# Patient Record
Sex: Male | Born: 1965 | Race: White | Hispanic: No | Marital: Single
Health system: Southern US, Community
[De-identification: ages and names within clinical notes are randomized; demographics above are authoritative.]

## PROBLEM LIST (undated history)

## (undated) DIAGNOSIS — R739 Hyperglycemia, unspecified: Secondary | ICD-10-CM

## (undated) DIAGNOSIS — I10 Essential (primary) hypertension: Secondary | ICD-10-CM

## (undated) HISTORY — DX: Hyperglycemia, unspecified: R73.9

## (undated) HISTORY — DX: Essential (primary) hypertension: I10

---

## 2021-04-04 ENCOUNTER — Inpatient Hospital Stay
Admission: EM | Admit: 2021-04-04 | Payer: Self-pay | Source: Other Acute Inpatient Hospital | Admitting: Interventional Radiology

## 2021-04-04 ENCOUNTER — Emergency Department (HOSPITAL_COMMUNITY): Payer: Medicaid Other

## 2021-04-04 ENCOUNTER — Inpatient Hospital Stay (HOSPITAL_COMMUNITY): Payer: Medicaid Other

## 2021-04-04 ENCOUNTER — Inpatient Hospital Stay (HOSPITAL_COMMUNITY): Payer: Medicaid Other | Admitting: Anesthesiology

## 2021-04-04 ENCOUNTER — Encounter (HOSPITAL_COMMUNITY): Payer: Self-pay | Admitting: Anesthesiology

## 2021-04-04 ENCOUNTER — Encounter (HOSPITAL_COMMUNITY)
Admission: EM | Disposition: A | Discharge status: Home / Self Care | Payer: Self-pay | Source: Other Acute Inpatient Hospital | Attending: Neurology

## 2021-04-04 ENCOUNTER — Encounter (HOSPITAL_COMMUNITY): Payer: Self-pay | Admitting: Neurology

## 2021-04-04 ENCOUNTER — Inpatient Hospital Stay (HOSPITAL_COMMUNITY)
Admission: EM | Admit: 2021-04-04 | Discharge: 2021-04-07 | DRG: 023 | Disposition: A | Discharge status: Home / Self Care | Payer: Medicaid Other | Source: Other Acute Inpatient Hospital | Attending: Neurology | Admitting: Neurology

## 2021-04-04 ENCOUNTER — Encounter (HOSPITAL_COMMUNITY): Payer: Self-pay | Admitting: Certified Registered Nurse Anesthetist

## 2021-04-04 ENCOUNTER — Inpatient Hospital Stay: Admit: 2021-04-04 | Payer: Medicaid Other | Admitting: Neurology

## 2021-04-04 DIAGNOSIS — I63412 Cerebral infarction due to embolism of left middle cerebral artery: Secondary | ICD-10-CM

## 2021-04-04 DIAGNOSIS — E785 Hyperlipidemia, unspecified: Secondary | ICD-10-CM | POA: Diagnosis present

## 2021-04-04 DIAGNOSIS — I6389 Other cerebral infarction: Secondary | ICD-10-CM

## 2021-04-04 DIAGNOSIS — Z20822 Contact with and (suspected) exposure to covid-19: Secondary | ICD-10-CM | POA: Diagnosis present

## 2021-04-04 DIAGNOSIS — I609 Nontraumatic subarachnoid hemorrhage, unspecified: Secondary | ICD-10-CM | POA: Diagnosis present

## 2021-04-04 DIAGNOSIS — I63512 Cerebral infarction due to unspecified occlusion or stenosis of left middle cerebral artery: Secondary | ICD-10-CM | POA: Diagnosis present

## 2021-04-04 DIAGNOSIS — R29704 NIHSS score 4: Secondary | ICD-10-CM | POA: Diagnosis present

## 2021-04-04 DIAGNOSIS — I10 Essential (primary) hypertension: Secondary | ICD-10-CM | POA: Diagnosis present

## 2021-04-04 DIAGNOSIS — L0889 Other specified local infections of the skin and subcutaneous tissue: Secondary | ICD-10-CM | POA: Diagnosis present

## 2021-04-04 DIAGNOSIS — F17200 Nicotine dependence, unspecified, uncomplicated: Secondary | ICD-10-CM | POA: Diagnosis present

## 2021-04-04 DIAGNOSIS — J9601 Acute respiratory failure with hypoxia: Secondary | ICD-10-CM | POA: Diagnosis present

## 2021-04-04 DIAGNOSIS — Q211 Atrial septal defect: Secondary | ICD-10-CM

## 2021-04-04 DIAGNOSIS — R29810 Facial weakness: Secondary | ICD-10-CM | POA: Diagnosis present

## 2021-04-04 DIAGNOSIS — R4701 Aphasia: Secondary | ICD-10-CM | POA: Diagnosis present

## 2021-04-04 DIAGNOSIS — I639 Cerebral infarction, unspecified: Secondary | ICD-10-CM

## 2021-04-04 DIAGNOSIS — Z978 Presence of other specified devices: Secondary | ICD-10-CM

## 2021-04-04 DIAGNOSIS — I6602 Occlusion and stenosis of left middle cerebral artery: Secondary | ICD-10-CM | POA: Diagnosis present

## 2021-04-04 DIAGNOSIS — I6340 Cerebral infarction due to embolism of unspecified cerebral artery: Secondary | ICD-10-CM | POA: Insufficient documentation

## 2021-04-04 HISTORY — PX: IR CT HEAD LTD: IMG2386

## 2021-04-04 HISTORY — PX: IR PERCUTANEOUS ART THROMBECTOMY/INFUSION INTRACRANIAL INC DIAG ANGIO: IMG6087

## 2021-04-04 LAB — RAPID URINE DRUG SCREEN, HOSP PERFORMED
Amphetamines: NOT DETECTED
Barbiturates: NOT DETECTED
Benzodiazepines: NOT DETECTED
Cocaine: NOT DETECTED
Opiates: NOT DETECTED
Tetrahydrocannabinol: POSITIVE — AB

## 2021-04-04 LAB — ECHOCARDIOGRAM COMPLETE
Area-P 1/2: 3.37 cm2
Height: 64 in
S' Lateral: 3.6 cm
Weight: 2144.63 oz

## 2021-04-04 LAB — POCT I-STAT 7, (LYTES, BLD GAS, ICA,H+H)
Acid-Base Excess: 0 mmol/L (ref 0.0–2.0)
Bicarbonate: 24.5 mmol/L (ref 20.0–28.0)
Calcium, Ion: 1.16 mmol/L (ref 1.15–1.40)
HCT: 41 % (ref 39.0–52.0)
Hemoglobin: 13.9 g/dL (ref 13.0–17.0)
O2 Saturation: 99 %
Potassium: 4 mmol/L (ref 3.5–5.1)
Sodium: 141 mmol/L (ref 135–145)
TCO2: 26 mmol/L (ref 22–32)
pCO2 arterial: 40.2 mmHg (ref 32.0–48.0)
pH, Arterial: 7.393 (ref 7.350–7.450)
pO2, Arterial: 135 mmHg — ABNORMAL HIGH (ref 83.0–108.0)

## 2021-04-04 LAB — RESP PANEL BY RT-PCR (FLU A&B, COVID) ARPGX2
Influenza A by PCR: NEGATIVE
Influenza B by PCR: NEGATIVE
SARS Coronavirus 2 by RT PCR: NEGATIVE

## 2021-04-04 LAB — HIV ANTIBODY (ROUTINE TESTING W REFLEX): HIV Screen 4th Generation wRfx: NONREACTIVE

## 2021-04-04 LAB — CBG MONITORING, ED: Glucose-Capillary: 99 mg/dL (ref 70–99)

## 2021-04-04 LAB — MRSA PCR SCREENING: MRSA by PCR: NEGATIVE

## 2021-04-04 SURGERY — IR WITH ANESTHESIA
Anesthesia: General

## 2021-04-04 MED ORDER — CLOPIDOGREL BISULFATE 300 MG PO TABS
ORAL_TABLET | ORAL | Status: AC
  Filled 2021-04-04: qty 1

## 2021-04-04 MED ORDER — PROPOFOL 1000 MG/100ML IV EMUL
0.0000 ug/kg/min | INTRAVENOUS | Status: DC
  Administered 2021-04-04: 50 ug/kg/min via INTRAVENOUS

## 2021-04-04 MED ORDER — ACETAMINOPHEN 650 MG RE SUPP: 650.0000 mg | RECTAL | Status: DC | PRN

## 2021-04-04 MED ORDER — POLYETHYLENE GLYCOL 3350 17 G PO PACK: 17.0000 g | PACK | Freq: Every day | ORAL | Status: DC

## 2021-04-04 MED ORDER — ACETAMINOPHEN 160 MG/5ML PO SOLN: 650.0000 mg | ORAL | Status: DC | PRN

## 2021-04-04 MED ORDER — ROCURONIUM BROMIDE 10 MG/ML (PF) SYRINGE
PREFILLED_SYRINGE | INTRAVENOUS | Status: DC | PRN
  Administered 2021-04-04: 40 mg via INTRAVENOUS
  Administered 2021-04-04: 50 mg via INTRAVENOUS

## 2021-04-04 MED ORDER — IOHEXOL 300 MG/ML  SOLN
150.0000 mL | Freq: Once | INTRAMUSCULAR | Status: AC | PRN
  Administered 2021-04-04: 90 mL via INTRA_ARTERIAL

## 2021-04-04 MED ORDER — IPRATROPIUM-ALBUTEROL 0.5-2.5 (3) MG/3ML IN SOLN: 3.0000 mL | RESPIRATORY_TRACT | Status: DC | PRN

## 2021-04-04 MED ORDER — SUCCINYLCHOLINE CHLORIDE 20 MG/ML IJ SOLN
INTRAMUSCULAR | Status: DC | PRN
  Administered 2021-04-04: 100 mg via INTRAVENOUS

## 2021-04-04 MED ORDER — CHLORHEXIDINE GLUCONATE CLOTH 2 % EX PADS
6.0000 | MEDICATED_PAD | Freq: Every day | CUTANEOUS | Status: DC
  Administered 2021-04-05 – 2021-04-06 (×2): 6 via TOPICAL

## 2021-04-04 MED ORDER — CEFAZOLIN SODIUM-DEXTROSE 2-3 GM-%(50ML) IV SOLR
INTRAVENOUS | Status: DC | PRN
  Administered 2021-04-04: 2 g via INTRAVENOUS

## 2021-04-04 MED ORDER — TIROFIBAN HCL IN NACL 5-0.9 MG/100ML-% IV SOLN
INTRAVENOUS | Status: AC
  Filled 2021-04-04: qty 100

## 2021-04-04 MED ORDER — ATORVASTATIN CALCIUM 40 MG PO TABS
40.0000 mg | ORAL_TABLET | Freq: Every day | ORAL | Status: DC
  Administered 2021-04-04 – 2021-04-07 (×4): 40 mg via ORAL
  Filled 2021-04-04 (×4): qty 1

## 2021-04-04 MED ORDER — LACTATED RINGERS IV SOLN: INTRAVENOUS | Status: DC | PRN

## 2021-04-04 MED ORDER — CANGRELOR TETRASODIUM 50 MG IV SOLR
INTRAVENOUS | Status: AC
  Filled 2021-04-04: qty 50

## 2021-04-04 MED ORDER — ASPIRIN 300 MG RE SUPP: 300.0000 mg | Freq: Every day | RECTAL | Status: DC

## 2021-04-04 MED ORDER — PANTOPRAZOLE SODIUM 40 MG IV SOLR
40.0000 mg | Freq: Every day | INTRAVENOUS | Status: DC
  Administered 2021-04-04 – 2021-04-05 (×2): 40 mg via INTRAVENOUS
  Filled 2021-04-04 (×2): qty 40

## 2021-04-04 MED ORDER — NITROGLYCERIN 1 MG/10 ML FOR IR/CATH LAB
INTRA_ARTERIAL | Status: AC
  Filled 2021-04-04: qty 10

## 2021-04-04 MED ORDER — EPHEDRINE SULFATE-NACL 50-0.9 MG/10ML-% IV SOSY
PREFILLED_SYRINGE | INTRAVENOUS | Status: DC | PRN
  Administered 2021-04-04: 5 mg via INTRAVENOUS

## 2021-04-04 MED ORDER — PHENYLEPHRINE HCL-NACL 10-0.9 MG/250ML-% IV SOLN
INTRAVENOUS | Status: DC | PRN
  Administered 2021-04-04: 50 ug/min via INTRAVENOUS

## 2021-04-04 MED ORDER — LIDOCAINE HCL (CARDIAC) PF 100 MG/5ML IV SOSY
PREFILLED_SYRINGE | INTRAVENOUS | Status: DC | PRN
  Administered 2021-04-04: 60 mg via INTRATRACHEAL
  Administered 2021-04-04: 40 mg via INTRATRACHEAL

## 2021-04-04 MED ORDER — SODIUM CHLORIDE 0.9 % IV SOLN: INTRAVENOUS | Status: DC

## 2021-04-04 MED ORDER — GLYCOPYRROLATE 0.2 MG/ML IJ SOLN
INTRAMUSCULAR | Status: DC | PRN
  Administered 2021-04-04: .2 mg via INTRAVENOUS

## 2021-04-04 MED ORDER — CLEVIDIPINE BUTYRATE 0.5 MG/ML IV EMUL
INTRAVENOUS | Status: AC
  Filled 2021-04-04: qty 50

## 2021-04-04 MED ORDER — NITROGLYCERIN 1 MG/10 ML FOR IR/CATH LAB
INTRA_ARTERIAL | Status: AC | PRN
  Administered 2021-04-04: 25 ug via INTRA_ARTERIAL

## 2021-04-04 MED ORDER — STROKE: EARLY STAGES OF RECOVERY BOOK
Freq: Once | Status: AC
  Filled 2021-04-04: qty 1

## 2021-04-04 MED ORDER — ASPIRIN 325 MG PO TABS: 325.0000 mg | ORAL_TABLET | Freq: Every day | ORAL | Status: DC

## 2021-04-04 MED ORDER — PROPOFOL 500 MG/50ML IV EMUL
INTRAVENOUS | Status: DC | PRN
  Administered 2021-04-04: 50 ug/kg/min via INTRAVENOUS

## 2021-04-04 MED ORDER — FENTANYL CITRATE (PF) 100 MCG/2ML IJ SOLN
25.0000 ug | INTRAMUSCULAR | Status: DC | PRN
  Administered 2021-04-04: 50 ug via INTRAVENOUS
  Filled 2021-04-04: qty 2

## 2021-04-04 MED ORDER — PROPOFOL 1000 MG/100ML IV EMUL
INTRAVENOUS | Status: AC
  Filled 2021-04-04: qty 100

## 2021-04-04 MED ORDER — CLEVIDIPINE BUTYRATE 0.5 MG/ML IV EMUL
0.0000 mg/h | INTRAVENOUS | Status: DC
  Administered 2021-04-04: 1 mg/h via INTRAVENOUS

## 2021-04-04 MED ORDER — VERAPAMIL HCL 2.5 MG/ML IV SOLN
INTRAVENOUS | Status: AC
  Filled 2021-04-04: qty 2

## 2021-04-04 MED ORDER — DEXAMETHASONE SODIUM PHOSPHATE 10 MG/ML IJ SOLN
INTRAMUSCULAR | Status: DC | PRN
  Administered 2021-04-04: 10 mg via INTRAVENOUS

## 2021-04-04 MED ORDER — CEFAZOLIN SODIUM-DEXTROSE 2-4 GM/100ML-% IV SOLN
INTRAVENOUS | Status: AC
  Filled 2021-04-04: qty 100

## 2021-04-04 MED ORDER — ACETAMINOPHEN 325 MG PO TABS
650.0000 mg | ORAL_TABLET | ORAL | Status: DC | PRN
  Administered 2021-04-05 (×2): 650 mg via ORAL
  Filled 2021-04-04 (×2): qty 2

## 2021-04-04 MED ORDER — DOCUSATE SODIUM 50 MG/5ML PO LIQD: 100.0000 mg | Freq: Two times a day (BID) | ORAL | Status: DC

## 2021-04-04 MED ORDER — TICAGRELOR 90 MG PO TABS
ORAL_TABLET | ORAL | Status: AC
  Filled 2021-04-04: qty 2

## 2021-04-04 MED ORDER — IOHEXOL 240 MG/ML SOLN
INTRAMUSCULAR | Status: AC
  Administered 2021-04-04: 90 mL
  Filled 2021-04-04: qty 200

## 2021-04-04 MED ORDER — FENTANYL CITRATE (PF) 100 MCG/2ML IJ SOLN
INTRAMUSCULAR | Status: DC | PRN
  Administered 2021-04-04 (×2): 50 ug via INTRAVENOUS

## 2021-04-04 MED ORDER — ASPIRIN 81 MG PO CHEW
CHEWABLE_TABLET | ORAL | Status: AC
  Filled 2021-04-04: qty 1

## 2021-04-04 MED ORDER — ONDANSETRON HCL 4 MG/2ML IJ SOLN
INTRAMUSCULAR | Status: DC | PRN
  Administered 2021-04-04: 4 mg via INTRAVENOUS

## 2021-04-04 MED ORDER — PHENYLEPHRINE 40 MCG/ML (10ML) SYRINGE FOR IV PUSH (FOR BLOOD PRESSURE SUPPORT)
PREFILLED_SYRINGE | INTRAVENOUS | Status: DC | PRN
Start: 2021-04-04 — End: 2021-04-04
  Administered 2021-04-04 (×4): 40 ug via INTRAVENOUS

## 2021-04-04 MED ORDER — ACETAMINOPHEN 325 MG PO TABS: 650.0000 mg | ORAL_TABLET | ORAL | Status: DC | PRN

## 2021-04-04 MED ORDER — PROPOFOL 10 MG/ML IV BOLUS
INTRAVENOUS | Status: DC | PRN
  Administered 2021-04-04: 30 mg via INTRAVENOUS
  Administered 2021-04-04: 110 mg via INTRAVENOUS
  Administered 2021-04-04: 20 mg via INTRAVENOUS

## 2021-04-04 MED ORDER — EPTIFIBATIDE 20 MG/10ML IV SOLN
INTRAVENOUS | Status: AC
  Filled 2021-04-04: qty 10

## 2021-04-04 MED ORDER — IOHEXOL 350 MG/ML SOLN
40.0000 mL | Freq: Once | INTRAVENOUS | Status: AC | PRN
  Administered 2021-04-04: 40 mL via INTRAVENOUS

## 2021-04-04 NOTE — Progress Notes (Signed)
RT NOTES: Pt transported to CT and back to room 4N15 on vent without incident.

## 2021-04-04 NOTE — Care Plan (Signed)
55 year old s/p left MCA M1 thrombectomy, embolic stroke of unknown source at this time  Head CT from 4 PM demonstrating worsening subarachnoid hemorrhage compared to flat panel taken immediately post IR, discussed with radiology and personally reviewed  Current vital signs: BP 115/81   Pulse 63   Temp 97.7 F (36.5 C) (Axillary)   Resp 15   Ht 5\' 4"  (1.626 m)   Wt 60.8 kg   SpO2 99%   BMI 23.01 kg/m  Vital signs in last 24 hours: Temp:  [97.3 F (36.3 C)-97.7 F (36.5 C)] 97.7 F (36.5 C) (05/16 1600) Pulse Rate:  [48-99] 63 (05/16 1900) Resp:  [0-21] 15 (05/16 1900) BP: (82-147)/(57-96) 115/81 (05/16 1900) SpO2:  [95 %-100 %] 99 % (05/16 1900) Arterial Line BP: (63-176)/(47-87) 63/54 (05/16 1700) FiO2 (%):  [40 %] 40 % (05/16 1516) Weight:  [60.8 kg] 60.8 kg (05/16 0725)  Patient is currently not on any anticoagulants or antiplatelets, is ordered to receive aspirin tomorrow at 10 AM  Plan -Will discontinue aspirin for now -Repeat head imaging in the morning to confirm stability of hemorrhage to guide decision making on when to restart antiplatelet agent (MRI already ordered for 5 AM)  07-07-2005 MD-PhD [facility] [phone]

## 2021-04-04 NOTE — H&P (Signed)
Neurology H&P  CC: Aphasia  History is obtained from: referring physician  HPI: Caleb Donaldson is a 55 y.o. male with no significant past medical history of than tobacco abuse who presents with aphasia.  He was last known well around 7 PM, and then on awakening it was noticed that he was having difficulty speaking.  He was taken to [facility] where his NIH was six and a CTA was performed which demonstrated an M1 occlusion.  He was unfortunately outside the tPA window, but given the large vessel occlusion and the significant aphasia he was transferred to [facility] for consideration of thrombectomy.  He did have some improvement en route, but he still had a significant disabling aphasia and therefore thrombectomy was discussed with the patient and his children and decision was made to proceed.   LKW: 7 PM tpa given?: No, outside of window IR Thrombectomy?  Yes Modified Rankin Scale: 0-Completely asymptomatic and back to baseline post- stroke NIHSS: 4   ROS:  Unable to obtain due to altered mental status.   History reviewed. No pertinent past medical history.   No family history on file.   Social History: +smoking   Prior to Admission medications   Not on File     Exam: Current vital signs: BP (!) 128/96   Pulse 99   Resp 15   SpO2 98%    Physical Exam  Constitutional: Appears well-developed and well-nourished.  Psych: Affect appropriate to situation Eyes: No scleral injection HENT: No OP obstrucion Head: Normocephalic.  Cardiovascular: Normal rate and regular rhythm.  Respiratory: Effort normal and breath sounds normal to anterior ascultation GI: Soft.  No distension. There is no tenderness.  Skin: WDI  Neuro: Mental Status: Patient is awake, alert, he has a significant aphasia.  He is able to name objects, but unable to repeat, very simple commands he is able to comply with, but even mildly difficult commands (touch your left ear with your right thumb) he  is unable to comprehend. Cranial Nerves: II: Visual Fields are full. Pupils are equal, round, and reactive to light.   III,IV, VI: EOMI without ptosis or diploplia.  V: Facial sensation is symmetric to temperature VII: Facial movement with right facial weakness VIII: hearing is intact to voice X: Uvula elevates symmetrically XI: Shoulder shrug is symmetric. XII: tongue is midline without atrophy or fasciculations.  Motor: Tone is normal. Bulk is normal. 5/5 strength was present in all four extremities.  Sensory: Sensation is symmetric to light touch and temperature in the arms and legs.  He does appear to have some extinction to double simultaneous stimulation Cerebellar: No clear ataxia   I have reviewed labs in epic and the pertinent results are: WBC 7.0 Hemoglobin 15.8 Hematocrit 47.9  INR 1.0 PTT 29.8  Sodium 139 Potassium 4.2 Chloride 108 CO2 26 Glucose 107 Creatinine 0.7 Protein 8.9 Albumin 4.1 Normal AST and ALT.  I have reviewed the images obtained: CT/CTA/CTP-left M1 occlusion with small area of core infarct.  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   Secondary Diagnosis:   Impression: 55 year old male with acute M1 occlusion of unclear etiology.  Though his symptoms are on the milder side, given the significance of his aphasia and the results of his CT perfusion scan he is going for mechanical thrombectomy.  He will need further work-up for stroke risk factors  Plan: - HgbA1c, fasting lipid panel - MRI, MRA  of the brain without  contrast - Frequent neuro checks - Echocardiogram - Carotid dopplers - Prophylactic therapy-pending intervention - Risk factor modification - Telemetry monitoring - PT consult, OT consult, Speech consult - Stroke team to follow    This patient is critically ill and at significant risk of neurological worsening, death and care requires constant monitoring of vital  signs, hemodynamics,respiratory and cardiac monitoring, neurological assessment, discussion with family, other specialists and medical decision making of high complexity. I spent 50 minutes of neurocritical care time  in the care of  this patient. This was time spent independent of any time provided by nurse practitioner or PA.  Ritta Slot, MD [facility] [phone]  If 7pm- 7am, please page neurology on call as listed in AMION.

## 2021-04-04 NOTE — Procedures (Signed)
S/P Lt common carotid arteriogram Rt CFA approach . S/P revascularization of Lt MCA , and prox sup and inf divions with x 2 passes with 5 mm x 37 mm embotrap and x 1 pass with contact aspiration achieving a TICI 3 revascularization. Post CT brain mod hyper density  Overlying Ly sylvian fissure and Lt temp parietal region . No mass effect. 29f exoseal for RT CFA hemostasis . Distal pulses all dopplerable. Left intubated due to [facility]?contrast . S.Jasmynn Pfalzgraf MD

## 2021-04-04 NOTE — Procedures (Signed)
Extubation Procedure Note  Patient Details:   Name: Caleb Donaldson DOB: 1966/05/07 MRN: [id number]   Airway Documentation:   Pt extubated per orders, pt had positive cuff leak prior to extubation. Patient has strong cough and able to voice. Placed on 4lpm humidified oxygen. Will continue to monitor. Vent end date: 04/04/21 Vent end time: 1627   Evaluation  O2 sats: stable throughout Complications: No apparent complications Patient did tolerate procedure well. Bilateral Breath Sounds: Clear   Yes  Suszanne Conners 04/04/2021, 4:27 PM

## 2021-04-04 NOTE — Progress Notes (Signed)
Pt arrived from NIR at 1015.Groin site and peripheral pulses WNL. He is sedated on vent. q 15 min VS mNIHSS groin checks for one hr then q 30 min for one hr then hourly.

## 2021-04-04 NOTE — Progress Notes (Signed)
Referring Physician(s): Code stoke- Rejeana Brock (neurology)  Supervising Physician: Julieanne Cotton  Patient Status:  [facility] - In-pt  Chief Complaint:  History of acute CVA s/p cerebral arteriogram with emergent mechanical thrombectomy of left MCA occlusion achieving a TICI 3 revascularization via right femoral approach 04/04/2021 by Dr. Corliss Skains.  Subjective:  Patient laying in bed intubated with sedation. He opens eyes to voice, follows simple commands, nods appropriately to yes/no questions. Moving all extremities.   Allergies: Patient has no known allergies.  Medications: Prior to Admission medications   Not on File     Vital Signs: BP 115/76   Pulse 60   Temp (!) 97.3 F (36.3 C) (Axillary)   Resp 18   Ht 5\' 4"  (1.626 m)   Wt 134 lb 0.6 oz (60.8 kg)   SpO2 100%   BMI 23.01 kg/m   Physical Exam Vitals and nursing note reviewed.  Constitutional:      General: He is not in acute distress.    Comments: Intubated with sedation.  Pulmonary:     Effort: Pulmonary effort is normal. No respiratory distress.     Comments: Intubated with sedation. Skin:    General: Skin is warm and dry.     Comments: Right femoral puncture site stable without active bleeding or hematoma.  Neurological:     Comments: Intubated with sedation. Opens eyes to voice, follows simple commands, nods appropriately to yes/no questions. PERRL bilaterally. EOMs intact bilaterally without nystagmus or subjective diplopia. Moving all extremities. Distal pulses (DPs) palpable bilaterally.     Imaging: CT CEREBRAL PERFUSION W CONTRAST  Result Date: 04/04/2021 CLINICAL DATA:  Stroke follow-up.  Aphasia EXAM: CT PERFUSION BRAIN TECHNIQUE: Multiphase CT imaging of the brain was performed following IV bolus contrast injection. Subsequent parametric perfusion maps were calculated using RAPID software. CONTRAST:  29mL OMNIPAQUE IOHEXOL 350 MG/ML SOLN COMPARISON:  CTA of the head neck  from earlier today at [facility] FINDINGS: CT Brain Perfusion Findings: CBF (<30%) Volume: 67mL Perfusion (Tmax>6.0s) volume: 47mL Mismatch Volume: 81mL ASPECTS on noncontrast CT Head: 10 on head CT performed at the same time Infarct Core: 14 cc mL Infarction Location: Lateral left frontal lobe. IMPRESSION: Good correlation with prior CTA and aspects. 79 cc of left MCA penumbra with a background of 14 cc core infarct. Electronically Signed   By: 78m M.D.   On: 04/04/2021 07:49   Portable Chest x-ray  Result Date: 04/04/2021 CLINICAL DATA:  ET tube placement. EXAM: PORTABLE CHEST 1 VIEW COMPARISON:  12/23/2012 FINDINGS: 1144 hours. Endotracheal tube tip is 4.2 cm above the base of the carina. The NG tube passes into the stomach although the distal tip position is not included on the film. The lungs are clear without focal pneumonia, edema, pneumothorax or pleural effusion. The cardiopericardial silhouette is within normal limits for size. The visualized bony structures of the thorax show no acute abnormality. Telemetry leads overlie the chest. IMPRESSION: No active disease. Electronically Signed   By: 02/20/2013 M.D.   On: 04/04/2021 12:18   ECHOCARDIOGRAM COMPLETE  Result Date: 04/04/2021    ECHOCARDIOGRAM REPORT   Patient Name:   Caleb Donaldson Date of Exam: 04/04/2021 Medical Rec #:  04/06/2021      Height: Accession #:    [id number]     Weight:       134.0 lb Date of Birth:  July 30, 1966      BSA:          1.546  m Patient Age:    55 years       BP:           140/96 mmHg Patient Gender: M              HR:           77 bpm. Exam Location:  Inpatient Procedure: 2D Echo Indications:    Hx of CVA  History:        Patient has no prior history of Echocardiogram examinations.  Referring Phys: [id number] MCNEILL P KIRKPATRICK IMPRESSIONS  1. Left ventricular ejection fraction, by estimation, is 55 to 60%. The left ventricle has normal function. The left ventricle has no regional wall motion  abnormalities. Left ventricular diastolic parameters were normal.  2. Right ventricular systolic function is normal. The right ventricular size is normal.  3. Right atrial size was mildly dilated.  4. The mitral valve is normal in structure. Trivial mitral valve regurgitation.  5. The aortic valve is normal in structure. Aortic valve regurgitation is not visualized.  6. The inferior vena cava is normal in size with greater than 50% respiratory variability, suggesting right atrial pressure of 3 mmHg. FINDINGS  Left Ventricle: Left ventricular ejection fraction, by estimation, is 55 to 60%. The left ventricle has normal function. The left ventricle has no regional wall motion abnormalities. The left ventricular internal cavity size was normal in size. There is  no left ventricular hypertrophy. Left ventricular diastolic parameters were normal. Right Ventricle: The right ventricular size is normal. Right vetricular wall thickness was not assessed. Right ventricular systolic function is normal. Left Atrium: Left atrial size was normal in size. Right Atrium: Right atrial size was mildly dilated. Pericardium: There is no evidence of pericardial effusion. Mitral Valve: The mitral valve is normal in structure. Trivial mitral valve regurgitation. Tricuspid Valve: The tricuspid valve is normal in structure. Tricuspid valve regurgitation is trivial. Aortic Valve: The aortic valve is normal in structure. Aortic valve regurgitation is not visualized. Pulmonic Valve: The pulmonic valve was not well visualized. Pulmonic valve regurgitation is not visualized. Aorta: The aortic root is normal in size and structure. Venous: The inferior vena cava is normal in size with greater than 50% respiratory variability, suggesting right atrial pressure of 3 mmHg. IAS/Shunts: No atrial level shunt detected by color flow Doppler.  LEFT VENTRICLE PLAX 2D LVIDd:         4.20 cm  Diastology LVIDs:         3.60 cm  LV e' medial:    9.68 cm/s LV PW:          1.00 cm  LV E/e' medial:  6.3 LV IVS:        0.70 cm  LV e' lateral:   12.50 cm/s LVOT diam:     1.90 cm  LV E/e' lateral: 4.9 LV SV:         51 LV SV Index:   33 LVOT Area:     2.84 cm  RIGHT VENTRICLE            IVC RV Basal diam:  3.10 cm    IVC diam: 1.50 cm RV Mid diam:    2.50 cm RV S prime:     9.46 cm/s TAPSE (M-mode): 1.6 cm LEFT ATRIUM             Index       RIGHT ATRIUM           Index LA diam:  2.60 cm 1.68 cm/m  RA Area:     11.80 cm LA Vol (A2C):   29.2 ml 18.88 ml/m RA Volume:   28.00 ml  18.11 ml/m LA Vol (A4C):   14.1 ml 9.12 ml/m LA Biplane Vol: 21.3 ml 13.77 ml/m  AORTIC VALVE LVOT Vmax:   74.40 cm/s LVOT Vmean:  52.100 cm/s LVOT VTI:    0.181 m  AORTA Ao Root diam: 3.10 cm Ao Asc diam:  2.70 cm MITRAL VALVE MV Area (PHT): 3.37 cm    SHUNTS MV Decel Time: 225 msec    Systemic VTI:  0.18 m MV E velocity: 61.30 cm/s  Systemic Diam: 1.90 cm MV A velocity: 52.30 cm/s MV E/A ratio:  1.17 Dietrich Pates MD Electronically signed by Dietrich Pates MD Signature Date/Time: 04/04/2021/11:43:32 AM    Final    CT HEAD CODE STROKE WO CONTRAST  Result Date: 04/04/2021 CLINICAL DATA:  Code stroke.  Stroke follow-up EXAM: CT HEAD WITHOUT CONTRAST TECHNIQUE: Contiguous axial images were obtained from the base of the skull through the vertex without intravenous contrast. COMPARISON:  Earlier today FINDINGS: Brain: No evidence of acute infarction, hemorrhage, hydrocephalus, extra-axial collection or mass lesion/mass effect. Small remote left cerebellar infarct. Vascular: High-density vessels diffusely in the setting of remote cyst in IV contrast Skull: Normal. Negative for fracture or focal lesion. Sinuses/Orbits: Sinus opacification on a chronic or recurrent basis, incidental to the history. Other: These results were called by telephone at the time of interpretation on 04/04/2021 at 7:41 am to provider MCNEILL [facility] , who verbally acknowledged these results. ASPECTS [facility] Stroke Program  Early CT Score) - Ganglionic level infarction (caudate, lentiform nuclei, internal capsule, insula, M1-M3 cortex): 7 - Supraganglionic infarction (M4-M6 cortex): 3 Total score (0-10 with 10 being normal): 10 IMPRESSION: Stable head CT.  ASPECTS remains 10. Electronically Signed   By: Marnee Spring M.D.   On: 04/04/2021 07:42   VAS [place] LOWER EXTREMITY VENOUS (DVT)  Result Date: 04/04/2021  Lower Venous DVT Study Patient Name:  WIN GUAJARDO  Date of Exam:   04/04/2021 Medical Rec #: [id number]       Accession #:    [id number] Date of Birth: 21-Apr-1966       Patient Gender: M Patient Age:   54Y Exam Location:  [facility] Procedure:      VAS [place] LOWER EXTREMITY VENOUS (DVT) Referring Phys: [id number] Marvel Plan --------------------------------------------------------------------------------  Indications: Stroke.  Comparison Study: no prior Performing Technologist: Blanch Media RVS  Examination Guidelines: A complete evaluation includes B-mode imaging, spectral Doppler, color Doppler, and power Doppler as needed of all accessible portions of each vessel. Bilateral testing is considered an integral part of a complete examination. Limited examinations for reoccurring indications may be performed as noted. The reflux portion of the exam is performed with the patient in reverse Trendelenburg.  +---------+---------------+---------+-----------+----------+--------------+ RIGHT    CompressibilityPhasicitySpontaneityPropertiesThrombus Aging +---------+---------------+---------+-----------+----------+--------------+ CFV      Full           Yes      Yes                                 +---------+---------------+---------+-----------+----------+--------------+ SFJ      Full                                                        +---------+---------------+---------+-----------+----------+--------------+  FV Prox  Full                                                         +---------+---------------+---------+-----------+----------+--------------+ FV Mid   Full                                                        +---------+---------------+---------+-----------+----------+--------------+ FV DistalFull                                                        +---------+---------------+---------+-----------+----------+--------------+ PFV      Full                                                        +---------+---------------+---------+-----------+----------+--------------+ POP      Full           Yes      Yes                                 +---------+---------------+---------+-----------+----------+--------------+ PTV      Full                                                        +---------+---------------+---------+-----------+----------+--------------+ PERO     Full                                                        +---------+---------------+---------+-----------+----------+--------------+   +---------+---------------+---------+-----------+----------+--------------+ LEFT     CompressibilityPhasicitySpontaneityPropertiesThrombus Aging +---------+---------------+---------+-----------+----------+--------------+ CFV      Full           Yes      Yes                                 +---------+---------------+---------+-----------+----------+--------------+ SFJ      Full                                                        +---------+---------------+---------+-----------+----------+--------------+ FV Prox  Full                                                        +---------+---------------+---------+-----------+----------+--------------+  FV Mid   Full                                                        +---------+---------------+---------+-----------+----------+--------------+ FV DistalFull                                                         +---------+---------------+---------+-----------+----------+--------------+ PFV      Full                                                        +---------+---------------+---------+-----------+----------+--------------+ POP      Full           Yes      Yes                                 +---------+---------------+---------+-----------+----------+--------------+ PTV      Full                                                        +---------+---------------+---------+-----------+----------+--------------+ PERO     Full                                                        +---------+---------------+---------+-----------+----------+--------------+     Summary: BILATERAL: - No evidence of deep vein thrombosis seen in the lower extremities, bilaterally. -No evidence of popliteal cyst, bilaterally.   *See table(s) above for measurements and observations.    Preliminary     Labs:  CBC: Recent Labs    04/04/21 1020  HGB 13.9  HCT 41.0    COAGS: No results for input(s): INR, APTT in the last 8760 hours.  BMP: Recent Labs    04/04/21 1020  NA 141  K 4.0     Assessment and Plan:  History of acute CVA s/p cerebral arteriogram with emergent mechanical thrombectomy of left MCA occlusion achieving a TICI 3 revascularization via right femoral approach 04/04/2021 by Dr. Corliss Skainseveshwar. Patient intubated/sedated but opens eyes to voice and follows simple commands, nods appropiately to yes/no questions, moving all extremities. Right femoral puncture site stable, distal pulses (DPs) palpable bilaterally. For CT head and possible extubation today. Further plans per neurology- appreciate and agree with management. NIR to follow.   Electronically Signed: Elwin MochaAlexandra Tri Chittick, PA-C 04/04/2021, 3:38 PM   I spent a total of 15 Minutes at the the patient's bedside AND on the patient's hospital floor or unit, greater than 50% of which was counseling/coordinating care for CVA s/p  revascularization.

## 2021-04-04 NOTE — Anesthesia Preprocedure Evaluation (Addendum)
Anesthesia Evaluation  Patient identified by MRN, date of birth, ID band Patient awake    Reviewed: Allergy & Precautions, NPO status , Patient's Chart, lab work & pertinent test results  Airway Mallampati: II  TM Distance: >3 FB Neck ROM: Full    Dental  (+) Edentulous Upper, Edentulous Lower, Dental Advisory Given   Pulmonary Current Smoker,    breath sounds clear to auscultation (-) wheezing      Cardiovascular negative cardio ROS   Rhythm:Regular Rate:Normal     Neuro/Psych CVA    GI/Hepatic negative GI ROS, Neg liver ROS,   Endo/Other  negative endocrine ROS  Renal/GU negative Renal ROS     Musculoskeletal negative musculoskeletal ROS (+)   Abdominal   Peds  Hematology negative hematology ROS (+)   Anesthesia Other Findings   Reproductive/Obstetrics                             Anesthesia Physical Anesthesia Plan  ASA: II and emergent  Anesthesia Plan: General   Post-op Pain Management:    Induction: Intravenous  PONV Risk Score and Plan: 2 and Ondansetron, Dexamethasone and Treatment may vary due to age or medical condition  Airway Management Planned: Oral ETT  Additional Equipment: Arterial line  Intra-op Plan:   Post-operative Plan: Extubation in OR  Informed Consent: I have reviewed the patients History and Physical, chart, labs and discussed the procedure including the risks, benefits and alternatives for the proposed anesthesia with the patient or authorized representative who has indicated his/her understanding and acceptance.     Dental advisory given  Plan Discussed with: CRNA  Anesthesia Plan Comments:        Anesthesia Quick Evaluation

## 2021-04-04 NOTE — Code Documentation (Signed)
Stroke Response Nurse Documentation Code Documentation  Caleb Donaldson is a 55 y.o. male arriving to [facility] ED via [facility] on 04/04/2021 with past medical hx of smoker. Code IR was activated by [facility] Emergency Department. Patient from home where he was LKW at 1900 on 04/03/2021 and now complaining of difficulty with speech. On No antithrombotic, he was given 300mg  Aspirin suppository today prior to transport to [facility]. Stroke team at the bedside on patient arrival. Patient cleared for CT by Dr. [facility]. Patient to CT with team. NIHSS 4, see documentation for details and code stroke times. Patient with disoriented, right facial droop, Global aphasia  and Sensory  neglect on exam. The following imaging was completed: CT, CTP. Following imaging pt taken to IR for intervention.  Bedside handoff with Criss Alvine., RN  Loraine Leriche  Rapid Response RN

## 2021-04-04 NOTE — Anesthesia Preprocedure Evaluation (Deleted)
Anesthesia Evaluation    Reviewed: Allergy & Precautions, Patient's Chart, lab work & pertinent test results, Unable to perform ROS - Chart review onlyPreop documentation limited or incomplete due to emergent nature of procedure.  Airway        Dental   Pulmonary neg pulmonary ROS,           Cardiovascular negative cardio ROS       Neuro/Psych negative neurological ROS     GI/Hepatic negative GI ROS, Neg liver ROS,   Endo/Other  negative endocrine ROS  Renal/GU negative Renal ROS     Musculoskeletal negative musculoskeletal ROS (+)   Abdominal   Peds  Hematology negative hematology ROS (+)   Anesthesia Other Findings   Reproductive/Obstetrics                             Anesthesia Physical Anesthesia Plan  ASA: emergent  Anesthesia Plan: General   Post-op Pain Management:    Induction:   PONV Risk Score and Plan: Ondansetron, Dexamethasone and Treatment may vary due to age or medical condition  Airway Management Planned: Oral ETT  Additional Equipment: Arterial line  Intra-op Plan:   Post-operative Plan: Possible Post-op intubation/ventilation  Informed Consent:   Plan Discussed with:   Anesthesia Plan Comments:         Anesthesia Quick Evaluation

## 2021-04-04 NOTE — Transfer of Care (Signed)
Immediate Anesthesia Transfer of Care Note  Patient: Caleb Donaldson  Procedure(s) Performed: IR PERCUTANEOUS ART THROMBECTOMY/INFUSION INTRACRANIAL INC DIAG ANGIO IR CT HEAD LTD  Patient Location: ICU  Anesthesia Type:General  Level of Consciousness: sedated and unresponsive  Airway & Oxygen Therapy: Patient remains intubated per anesthesia plan and Patient placed on Ventilator (see vital sign flow sheet for setting)  Post-op Assessment: Report given to RN and Post -op Vital signs reviewed and stable  Post vital signs: Reviewed and stable  Last Vitals:  Vitals Value Taken Time  BP 109/77 04/04/21 1015  Temp    Pulse 62 04/04/21 1019  Resp 18 04/04/21 1019  SpO2 100 % 04/04/21 1019  Vitals shown include unvalidated device data.  Last Pain: There were no vitals filed for this visit.       Complications: No complications documented.

## 2021-04-04 NOTE — Anesthesia Procedure Notes (Signed)
Procedure Name: Intubation Date/Time: 04/04/2021 7:49 AM Performed by: Marena Chancy, CRNA Pre-anesthesia Checklist: Patient identified, Emergency Drugs available, Suction available and Patient being monitored Patient Re-evaluated:Patient Re-evaluated prior to induction Oxygen Delivery Method: Circle System Utilized Preoxygenation: Pre-oxygenation with 100% oxygen Induction Type: IV induction, Rapid sequence and Cricoid Pressure applied Laryngoscope Size: 4 Grade View: Grade I Tube type: Oral Tube size: 7.5 mm Number of attempts: 1 Airway Equipment and Method: Stylet and Oral airway Placement Confirmation: ETT inserted through vocal cords under direct vision,  positive ETCO2 and breath sounds checked- equal and bilateral Tube secured with: Tape Dental Injury: Teeth and Oropharynx as per pre-operative assessment

## 2021-04-04 NOTE — Anesthesia Postprocedure Evaluation (Signed)
Anesthesia Post Note  Patient: Caleb Donaldson  Procedure(s) Performed: IR PERCUTANEOUS ART THROMBECTOMY/INFUSION INTRACRANIAL INC DIAG ANGIO IR CT HEAD LTD     Patient location during evaluation: ICU Anesthesia Type: General Level of consciousness: patient remains intubated per anesthesia plan and sedated Pain management: pain level controlled Vital Signs Assessment: post-procedure vital signs reviewed and stable Respiratory status: patient remains intubated per anesthesia plan, patient on ventilator - see flowsheet for VS and respiratory function stable Cardiovascular status: stable Anesthetic complications: no   No complications documented.  Last Vitals:  Vitals:   04/04/21 0753 04/04/21 1001  BP: (!) 128/96 (!) 141/89  Pulse: 99 78  Resp: 15 18  SpO2: 98% 100%    Last Pain: There were no vitals filed for this visit.               Kaylyn Layer

## 2021-04-04 NOTE — Progress Notes (Signed)
NAME:  Caleb Donaldson, MRN:  [id number], DOB:  1966-04-01, LOS: 0 ADMISSION DATE:  04/04/2021, CONSULTATION DATE: 5/16 REFERRING MD:  Arvilla Market MD, CHIEF COMPLAINT:  Aphasia  History of Present Illness:  Ashe Gago is a 55 y.o. with a reported past medical history of smoking, who presented to [place] on 5/16 and was found to have an acute M1 occlusion.  Per chart review, his last known normal was around 7pm on 5/15. He presented to the [facility] ED around 7AM with significant aphasia. Workup showed an M1 occlusion, OSH NIHSS 4. He was unfortionately outside the window to receive TPA. He was transferred to [facility] for thrombectomy.  NeuroIR completed thrombectomy on 6/16 with revascularization of the L MCA (TICI 3) after three passes. There was some reported concern of constrast extravization post procedure. He was transferred to 4N post procedure for further care.  PCCM was consulted for ventilatory assistance.    Pertinent  Medical History  Active Smoking  Significant Hospital Events: Including procedures, antibiotic start and stop dates in addition to other pertinent events   . 5/15 LKW 7pm . 5/16 presented to [place] 7am, [facility]. NIR thrombectomy of L MCA.  Interim History / Subjective:  See Above  Unable to obtain subjective evaluation due to patient status  Objective   Blood pressure (!) 141/89, pulse 78, resp. rate 18, height 5\' 4"  (1.626 m), weight 60.8 kg, SpO2 100 %.    Vent Mode: PRVC FiO2 (%):  [40 %] 40 % Set Rate:  [18 bmp] 18 bmp Vt Set:  [470 mL] 470 mL PEEP:  [5 cmH20] 5 cmH20   Intake/Output Summary (Last 24 hours) at 04/04/2021 1058 Last data filed at 04/04/2021 04/06/2021 Gross per 24 hour  Intake 850 ml  Output --  Net 850 ml   Filed Weights   04/04/21 0725  Weight: 60.8 kg    Examination: General: In bed, appears comfortable, no acute distress  HEENT: MM pink/moist, anicteric, trachea midline, ETT, OGT  Neuro: RASS -5, Sedated, chemically  paralyzed  CV: S1S2, NSR, no m/r/g appreciated PULM:  Clear in the upper lobes, clear in the lower lobes, thin secretions, chest expansion symmetric GI: soft, bsx4 hypoactive, soft   Extremities: warm/dry, no pretibial edema, capillary refill less than 3 seconds  Skin: Pustules present on BL dorsalis pedis. See images in chart  Labs/imaging that I havepersonally reviewed  (right click and "Reselect all SmartList Selections" daily)  ABG 7.39/40/135/24.5 UDS +THC Head CT [facility] Problem list     Assessment & Plan:  M1 Occlusion, S/P L MCA mechanical thrombectomy (TICI 3)  -Mangement per neurology/stroke, Stroke workup per neurology -Head CT/MRI in six hours -Goal BP 120-140 for first 24 hours. Goal normotension post 24 hours.  Acute Respiratory Failure requiring Mechanical Ventilation ABG 7.39/40/135/24.5, reports of paralytic pushes prior to procedure, not breathing over ventilator, no twitches on TOF -LTVV strategy with tidal volumes of 4-8 cc/kg ideal body weight -Goal plateau pressures of 30 and driving pressures of 15 -Wean PEEP/FiO2 for SpO2 92-98 -Follow intermittent CXR and ABG PRN -Continue propofol for next hour. Then obtain SAT/SBT -Sheath recently removed, will try to extubate patient if able. May attempt to wait until post MRI/CT for extubation depending on patient status. -VAP bundle  Active Smoking History -PRN duonebs -Smoking cessation education when appropriate  Pustules on BL dorsalis pedis See images in chart, ?unclear etiology -Follow up ECHO -Continue to monitor  Best practice (right click and "Reselect all  SmartList Selections" daily)  Diet:  NPO Pain/Anxiety/Delirium protocol (if indicated): Yes (RASS goal 0) VAP protocol (if indicated): Yes DVT prophylaxis: Contraindicated GI prophylaxis: PPI Glucose control:  SSI No Central venous access:  N/A Arterial line:  Yes, and it is still needed Foley:  N/A Mobility:  bed rest  PT  consulted: N/A Last date of multidisciplinary goals of care discussion [Per primary] Code Status:  full code Disposition: ICU  Labs   CBC: Recent Labs  Lab 04/04/21 1020  HGB 13.9  HCT 41.0    Basic Metabolic Panel: Recent Labs  Lab 04/04/21 1020  NA 141  K 4.0   GFR: CrCl cannot be calculated (No successful lab value found.). No results for input(s): PROCALCITON, WBC, LATICACIDVEN in the last 168 hours.  Liver Function Tests: No results for input(s): AST, ALT, ALKPHOS, BILITOT, PROT, ALBUMIN in the last 168 hours. No results for input(s): LIPASE, AMYLASE in the last 168 hours. No results for input(s): AMMONIA in the last 168 hours.  ABG    Component Value Date/Time   PHART 7.393 04/04/2021 1020   PCO2ART 40.2 04/04/2021 1020   PO2ART 135 (H) 04/04/2021 1020   HCO3 24.5 04/04/2021 1020   TCO2 26 04/04/2021 1020   O2SAT 99.0 04/04/2021 1020     Coagulation Profile: No results for input(s): INR, PROTIME in the last 168 hours.  Cardiac Enzymes: No results for input(s): CKTOTAL, CKMB, CKMBINDEX, TROPONINI in the last 168 hours.  HbA1C: No results found for: HGBA1C  CBG: Recent Labs  Lab 04/04/21 0727  GLUCAP 99    Review of Systems:   Unable to obtain ROS evaluation due to patient status   Past Medical History:  He,  has no past medical history on file.   Surgical History:  History reviewed. No pertinent surgical history.   Social History:      Family History:  His family history is not on file.   Allergies No Known Allergies   Home Medications  Prior to Admission medications   Not on File     Critical care time: 34 Minutes     Gershon Mussel., MSN, APRN, AGACNP-BC  Pulmonary & Critical Care  04/04/2021 , 10:58 AM  Please see [email or web address] for pager details  If no response, please call [phone] After hours, please call Elink at [phone]

## 2021-04-04 NOTE — CV Procedure (Signed)
INR. 54y RT H M LSW last night ?time MRS 0   New onset RT sided weakness and aphasia. CT brain No ICH and ASPECTS 10 CTA occluded Lt MCA M 1 seg with clot extension into sup and inf divisions.. CTP core iod approx 13 ml and penumbra of approx 90 ml Endovascular treatment discussed with daughter.Procedure,reasons and alternatives reviewed. Risks of ICH of 10 %,worsening neuro deficit,death and inability to revascularize discussed. Daughter expressed understanding and provided consent for the treatment. S.Hezikiah Retzloff MD

## 2021-04-04 NOTE — Progress Notes (Addendum)
NAME:  Caleb Donaldson, MRN:  [id number], DOB:  [birth year], LOS: 0 ADMISSION DATE:  04/04/2021, CONSULTATION DATE: 5/16 REFERRING MD:  Arvilla Market MD, CHIEF COMPLAINT:  Aphasia  History of Present Illness:  Caleb Donaldson is a 55 y.o. with a reported past medical history of smoking, who presented to [facility] on 5/16 and was found to have an acute M1 occlusion.  Per chart review, his last known normal was around 7pm on 5/15. He presented to the [facility] ED around 7AM with significant aphasia. Workup showed an M1 occlusion, OSH NIHSS 4. He was unfortionately outside the window to receive TPA. He was transferred to [facility] for thrombectomy.  NeuroIR completed thrombectomy on 5/16 with revascularization of the L MCA (TICI 3) after three passes. There was some reported concern of contrast extravasation post procedure. He was transferred to 4N post procedure for further care.  PCCM was consulted for ventilatory assistance.    Pertinent  Medical History  Active Smoking  Significant Hospital Events: Including procedures, antibiotic start and stop dates in addition to other pertinent events   . 5/15 LKW 7pm . 5/16 presented to [place] 7am, [facility]. NIR thrombectomy of L MCA.  Interim History / Subjective:  Unable to obtain subjective evaluation due to patient status  Objective   Blood pressure 99/63, pulse (!) 53, temperature (!) 97.3 F (36.3 C), temperature source Axillary, resp. rate (!) 0, height 5\' 4"  (1.626 m), weight 60.8 kg, SpO2 99 %.    Vent Mode: PRVC FiO2 (%):  [40 %] 40 % Set Rate:  [18 bmp] 18 bmp Vt Set:  [470 mL] 470 mL PEEP:  [5 cmH20] 5 cmH20   Intake/Output Summary (Last 24 hours) at 04/04/2021 1233 Last data filed at 04/04/2021 04/06/2021 Gross per 24 hour  Intake 850 ml  Output --  Net 850 ml   Filed Weights   04/04/21 0725  Weight: 60.8 kg    Examination: General: In bed, appears comfortable, no acute distress  HEENT: MM pink/moist, anicteric, ETT, OGT   Neuro: RASS -5, Sedated, chemically paralyzed  CV: S1S2, RRR, no murmur Lung: bilateral clear lung songs, no crepitations or crackles GI: soft, nontender and nondistended, normal bowel sound  Extremities: no pedal, normal capillary refill time Skin: pustules present on bilateral dorsum  Labs/imaging that I havepersonally reviewed  (right click and "Reselect all SmartList Selections" daily)  ABG 7.39/40/135/24.5 UDS +THC Head CT  Resolved Hospital Problem list     Assessment & Plan:  M1 Occlusion, S/P L MCA mechanical thrombectomy (TICI 3)  -Mangement per neurology/stroke, Stroke workup per neurology -Head CT in six hours as the patient has labile blood pressure -Goal BP 120-140 for first 24 hours. Goal normotension post 24 hours.  Acute Respiratory Failure requiring Mechanical Ventilation ABG 7.39/40/135/24.5, reports of paralytic pushes prior to procedure, not breathing over ventilator, no twitches on TOF -protective lung ventilation -VAP bundle -SAT/ SBT for liberation  Skin pustules B/L feet - unclear etiology -Follow up ECHO -Continue to monitor  Active smoking History -Smoking cessation education when appropriate    Best practice (right click and "Reselect all SmartList Selections" daily)  Diet:  NPO Pain/Anxiety/Delirium protocol (if indicated): Yes (RASS goal 0) VAP protocol (if indicated): Yes DVT prophylaxis: Contraindicated GI prophylaxis: PPI Glucose control:  SSI No Central venous access:  N/A Arterial line:  Yes, and it is still needed Foley:  N/A Mobility:  bed rest  PT consulted: N/A Last date of multidisciplinary goals of care discussion [Per primary]  Code Status:  full code Disposition: ICU  Labs   CBC: Recent Labs  Lab 04/04/21 1020  HGB 13.9  HCT 41.0    Basic Metabolic Panel: Recent Labs  Lab 04/04/21 1020  NA 141  K 4.0   GFR: CrCl cannot be calculated (No successful lab value found.). No results for input(s): PROCALCITON,  WBC, LATICACIDVEN in the last 168 hours.  Liver Function Tests: No results for input(s): AST, ALT, ALKPHOS, BILITOT, PROT, ALBUMIN in the last 168 hours. No results for input(s): LIPASE, AMYLASE in the last 168 hours. No results for input(s): AMMONIA in the last 168 hours.  ABG    Component Value Date/Time   PHART 7.393 04/04/2021 1020   PCO2ART 40.2 04/04/2021 1020   PO2ART 135 (H) 04/04/2021 1020   HCO3 24.5 04/04/2021 1020   TCO2 26 04/04/2021 1020   O2SAT 99.0 04/04/2021 1020     Coagulation Profile: No results for input(s): INR, PROTIME in the last 168 hours.  Cardiac Enzymes: No results for input(s): CKTOTAL, CKMB, CKMBINDEX, TROPONINI in the last 168 hours.  HbA1C: No results found for: HGBA1C  CBG: Recent Labs  Lab 04/04/21 0727  GLUCAP 99    Review of Systems:   Unable to obtain ROS evaluation due to patient status   Past Medical History:  He,  has no past medical history on file.   Surgical History:  History reviewed. No pertinent surgical history.   Social History:      Family History:  His family history is not on file.   Allergies No Known Allergies   Home Medications  Prior to Admission medications   Not on File

## 2021-04-04 NOTE — Anesthesia Procedure Notes (Signed)
Arterial Line Insertion Start/End5/16/2022 8:00 AM, 04/04/2021 8:05 AM Performed by: CRNA  Preanesthetic checklist: patient identified, IV checked, site marked, risks and benefits discussed, surgical consent, monitors and equipment checked, pre-op evaluation, timeout performed and anesthesia consent Left, radial was placed Catheter size: 20 G Hand hygiene performed  and maximum sterile barriers used   Attempts: 2 Procedure performed without using ultrasound guided technique. Following insertion, Biopatch and dressing applied. Post procedure assessment: normal  Patient tolerated the procedure well with no immediate complications.

## 2021-04-04 NOTE — ED Triage Notes (Signed)
Pt bib REMS from [facility] as a code stroke. LSN 1900 last night. 300 suppository aspirin was given by EMS. Vitals signs have been stable.   99 HR 98% RA 128/96  CBG 99

## 2021-04-04 NOTE — Progress Notes (Incomplete)
Patients daughter called and was transferred to IR, advised her that patient was still on table and we were treating his stroke.  Advised her to proceed to 1st floor radiology waiting where Corliss Skains would talk to her once we were finished.  Made front office and Lynford Humphrey, RN aware.  North tower entrance called and were apprehensive to send her to Radiology waiting and we once again advised them to send her down to 1st floor Radiology so Dr. Corliss Skains could speak with her once we're finished with the procedure.  Saivion Goettel RT-R

## 2021-04-04 NOTE — Sedation Documentation (Addendum)
Right groin sheath removed, 66fr exoseal closure device deployed to right groin site. Manual pressure being held a groin site by IR tech.

## 2021-04-04 NOTE — Progress Notes (Signed)
STROKE TEAM PROGRESS NOTE   SUBJECTIVE (INTERVAL HISTORY) His RNs and Dr. Merrily Pew CCM are at the bedside. Pt just arrived from IR post thrombectomy with TICI3 and small to moderate SAH. On propofol, still under influence of neuromuscular blockade. Still intubated, pending MRI and MRA.   OBJECTIVE Pulse Rate:  [71-99] 77 (05/16 1112) Resp:  [15-18] 18 (05/16 1112) BP: (128-147)/(84-96) 135/96 (05/16 1112) SpO2:  [95 %-100 %] 100 % (05/16 1112) FiO2 (%):  [40 %] 40 % (05/16 1112) Weight:  [60.8 kg] 60.8 kg (05/16 0725)  Recent Labs  Lab 04/04/21 0727  GLUCAP 99   Recent Labs  Lab 04/04/21 1020  NA 141  K 4.0   No results for input(s): AST, ALT, ALKPHOS, BILITOT, PROT, ALBUMIN in the last 168 hours. Recent Labs  Lab 04/04/21 1020  HGB 13.9  HCT 41.0   No results for input(s): CKTOTAL, CKMB, CKMBINDEX, TROPONINI in the last 168 hours. No results for input(s): LABPROT, INR in the last 72 hours. No results for input(s): COLORURINE, LABSPEC, PHURINE, GLUCOSEU, HGBUR, BILIRUBINUR, KETONESUR, PROTEINUR, UROBILINOGEN, NITRITE, LEUKOCYTESUR in the last 72 hours.  Invalid input(s): APPERANCEUR  No results found for: CHOL, TRIG, HDL, CHOLHDL, VLDL, LDLCALC No results found for: HGBA1C    Component Value Date/Time   LABOPIA NONE DETECTED 04/04/2021 0754   COCAINSCRNUR NONE DETECTED 04/04/2021 0754   LABBENZ NONE DETECTED 04/04/2021 0754   AMPHETMU NONE DETECTED 04/04/2021 0754   THCU POSITIVE (A) 04/04/2021 0754   LABBARB NONE DETECTED 04/04/2021 0754    No results for input(s): ETH in the last 168 hours.  I have personally reviewed the radiological images below and agree with the radiology interpretations.  CT CEREBRAL PERFUSION W CONTRAST  Result Date: 04/04/2021 CLINICAL DATA:  Stroke follow-up.  Aphasia EXAM: CT PERFUSION BRAIN TECHNIQUE: Multiphase CT imaging of the brain was performed following IV bolus contrast injection. Subsequent parametric perfusion maps were  calculated using RAPID software. CONTRAST:  30mL OMNIPAQUE IOHEXOL 350 MG/ML SOLN COMPARISON:  CT[facility] of the head neck from earlier [facility] FINDINGS: CT Brain Perfusion Findings: CBF (<30%) Volume: 63mL Perfusion (Tmax>6.0s) volume: 29mL Mismatch Volume: 75mL ASPECTS on noncontrast CT Head: 10 on head CT performed at the same time Infarct Core: 14 cc mL Infarction Location: Lateral left frontal lobe. IMPRESSION: Good correlation with prior CTA and aspects. 79 cc of left MCA penumbra with a background of 14 cc core infarct. Electronically Signed   By: Marnee Spring M.D.   On: 04/04/2021 07:49   CT HEAD CODE STROKE WO CONTRAST  Result Date: 04/04/2021 CLINICAL DATA:  Code stroke.  Stroke follow-up EXAM: CT HEAD WITHOUT CONTRAST TECHNIQUE: Contiguous axial images were obtained from the base of the skull through the vertex without intravenous contrast. COMPARISON:  Earlier today FINDINGS: Brain: No evidence of acute infarction, hemorrhage, hydrocephalus, extra-axial collection or mass lesion/mass effect. Small remote left cerebellar infarct. Vascular: High-density vessels diffusely in the setting of remote cyst in IV contrast Skull: Normal. Negative for fracture or focal lesion. Sinuses/Orbits: Sinus opacification on a chronic or recurrent basis, incidental to the history. Other: These results were called by telephone at the time of interpretation on 04/04/2021 at 7:41 am to provider MCNEILL [facility] , who verbally acknowledged these results. ASPECTS [facility] Stroke Program Early CT Score) - Ganglionic level infarction (caudate, lentiform nuclei, internal capsule, insula, M1-M3 cortex): 7 - Supraganglionic infarction (M4-M6 cortex): 3 Total score (0-10 with 10 being normal): 10 IMPRESSION: Stable head CT.  ASPECTS  remains 10. Electronically Signed   By: Marnee Spring M.D.   On: 04/04/2021 07:42    PHYSICAL EXAM  Pulse Rate:  [71-99] 77 (05/16 1112) Resp:  [15-18] 18 (05/16 1112) BP:  (128-147)/(84-96) 135/96 (05/16 1112) SpO2:  [95 %-100 %] 100 % (05/16 1112) FiO2 (%):  [40 %] 40 % (05/16 1112) Weight:  [60.8 kg] 60.8 kg (05/16 0725)  General - Well nourished, well developed, intubated on sedation.  Ophthalmologic - fundi not visualized due to noncooperation.  Cardiovascular - Regular rate and rhythm.  Skin - rashes at chest and abdomen consistent with tape allergy, however, also several small papule lesion at bilateral dorsal foot, concerning for infection  Neuro - intubated on sedation, eyes closed, not following commands. With forced eye opening, eyes in mid position, not blinking to visual threat, doll's eyes absent, not tracking, PERRL. Corneal reflex absent, gag and cough absent. Breathing not over the vent.  Facial symmetry not able to test due to ET tube.  Tongue protrusion not cooperative. On pain stimulation, no movement of extremities. DTR 0 and no babinski. Sensation, coordination and gait not tested.   ASSESSMENT/PLAN Mr. Caleb Donaldson is a 55 y.o. male with history of current smoker admitted for aphasia. No tPA given due to outside window.    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   Ongoing aggressive stroke risk factor management  Therapy recommendations: Pending  Disposition: Pending  Hypertension . Stable on the low end . BP goal 120-140 post IR with SAH . On Cleviprex  Long term BP goal normotensive  Hyperlipidemia  Home meds: None  LDL pending, goal < 70  Now on Lipitor 40  Continue statin at discharge  Tobacco  abuse  Current smoker  Smoking cessation counseling will be provided  Other Stroke Risk Factors  UDS positive for [facility]  Other Active Problems    Hospital day # 0  This patient is critically ill due to left MCA stroke, left M1 occlusion status post thrombectomy, SAH postprocedure and at significant risk of neurological worsening, death form recurrent stroke, hemorrhagic conversion, seizure. This patient's care requires constant monitoring of vital signs, hemodynamics, respiratory and cardiac monitoring, review of multiple databases, neurological assessment, discussion with family, other specialists and medical decision making of high complexity. I spent 30 minutes of neurocritical care time in the care of this patient.  Marvel Plan, MD PhD Stroke Neurology 04/04/2021 11:26 AM    To contact Stroke Continuity provider, please refer to [email or web address]. After hours, contact General Neurology

## 2021-04-04 NOTE — Progress Notes (Signed)
Lower extremity venous has been completed.   Preliminary results in CV Proc.   Blanch Media 04/04/2021 1:47 PM

## 2021-04-04 NOTE — ED Provider Notes (Signed)
[facility] EMERGENCY DEPARTMENT Provider Note   CSN: [id number] Arrival date & time: 04/04/21  0720  LEVEL 5 CAVEAT - APHASIA History Chief Complaint  Patient presents with  . Code Stroke    Caleb Donaldson is a 55 y.o. male.  HPI 55 year old male presents as a transfer from [facility] with acute M1 occlusion.  His last known normal was last night around 7 PM.  History is limited due to the patient's residual aphasia which has reportedly improved with EMS.  At this point he is transferred for possible IR thrombectomy.  No past medical history on file.  Patient Active Problem List   Diagnosis Date Noted  . Cerebral embolism with cerebral infarction 04/04/2021  . 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     Home Medications Prior to Admission medications   Not on File    Allergies    Patient has no allergy information on record.  Review of Systems   Review of Systems  Unable to perform ROS: Acuity of condition    Physical Exam Updated Vital Signs BP (!) 128/96   Pulse 99   Resp 15   SpO2 98%   Physical Exam Vitals and nursing note reviewed.  Constitutional:      General: He is not in acute distress.    Appearance: He is well-developed. He is not diaphoretic.  HENT:     Head: Normocephalic and atraumatic.     Right Ear: External ear normal.     Left Ear: External ear normal.     Nose: Nose normal.  Eyes:     General:        Right eye: No discharge.        Left eye: No discharge.  Pulmonary:     Effort: Pulmonary effort is normal.  Abdominal:     General: There is no distension.  Musculoskeletal:        General: No deformity.  Skin:    General: Skin is dry.  Neurological:     Mental Status: He is alert.  Psychiatric:        Mood and Affect: Mood is not anxious.     ED Results / Procedures / Treatments   Labs (all labs ordered are listed, but only abnormal results are displayed) Labs  Reviewed  RESP PANEL BY RT-PCR (FLU A&B, COVID) ARPGX2  HIV ANTIBODY (ROUTINE TESTING W REFLEX)  RAPID URINE DRUG SCREEN, HOSP PERFORMED  CBG MONITORING, ED    EKG None  Radiology CT CEREBRAL PERFUSION W CONTRAST  Result Date: 04/04/2021 CLINICAL DATA:  Stroke follow-up.  Aphasia EXAM: CT PERFUSION BRAIN TECHNIQUE: Multiphase CT imaging of the brain was performed following IV bolus contrast injection. Subsequent parametric perfusion maps were calculated using RAPID software. CONTRAST:  57mL OMNIPAQUE IOHEXOL 350 MG/ML SOLN COMPARISON:  CTA of the head neck from earlier today at [facility] FINDINGS: CT Brain Perfusion Findings: CBF (<30%) Volume: 1mL Perfusion (Tmax>6.0s) volume: 64mL Mismatch Volume: 26mL ASPECTS on noncontrast CT Head: 10 on head CT performed at the same time Infarct Core: 14 cc mL Infarction Location: Lateral left frontal lobe. IMPRESSION: Good correlation with prior CTA and aspects. 79 cc of left MCA penumbra with a background of 14 cc core infarct. Electronically Signed   By: Marnee Spring M.D.   On: 04/04/2021 07:49   CT HEAD CODE STROKE WO CONTRAST  Result Date: 04/04/2021 CLINICAL DATA:  Code stroke.  Stroke follow-up EXAM: CT HEAD WITHOUT CONTRAST TECHNIQUE: Contiguous axial images were obtained from the base of the skull through the vertex without intravenous contrast. COMPARISON:  Earlier today FINDINGS: Brain: No evidence of acute infarction, hemorrhage, hydrocephalus, extra-axial collection or mass lesion/mass effect. Small remote left cerebellar infarct. Vascular: High-density vessels diffusely in the setting of remote cyst in IV contrast Skull: Normal. Negative for fracture or focal lesion. Sinuses/Orbits: Sinus opacification on a chronic or recurrent basis, incidental to the history. Other: These results were called by telephone at the time of interpretation on 04/04/2021 at 7:41 am to provider MCNEILL [facility] , who verbally acknowledged these results.  ASPECTS [facility] Stroke Program Early CT Score) - Ganglionic level infarction (caudate, lentiform nuclei, internal capsule, insula, M1-M3 cortex): 7 - Supraganglionic infarction (M4-M6 cortex): 3 Total score (0-10 with 10 being normal): 10 IMPRESSION: Stable head CT.  ASPECTS remains 10. Electronically Signed   By: Marnee Spring M.D.   On: 04/04/2021 07:42    Procedures Procedures   Medications Ordered in ED Medications  iohexol (OMNIPAQUE) 240 MG/ML injection (has no administration in time range)  aspirin 81 MG chewable tablet (has no administration in time range)  verapamil (ISOPTIN) 2.5 MG/ML injection (has no administration in time range)  ticagrelor (BRILINTA) 90 MG tablet (has no administration in time range)  clopidogrel (PLAVIX) 300 MG tablet (has no administration in time range)  cangrelor (KENGREAL) 50 MG SOLR (has no administration in time range)  tirofiban (AGGRASTAT) 5-0.9 MG/100ML-% injection (has no administration in time range)  eptifibatide (INTEGRILIN) 20 MG/10ML injection (has no administration in time range)   stroke: mapping our early stages of recovery book (has no administration in time range)  0.9 %  sodium chloride infusion (has no administration in time range)  acetaminophen (TYLENOL) tablet 650 mg (has no administration in time range)    Or  acetaminophen (TYLENOL) 160 MG/5ML solution 650 mg (has no administration in time range)    Or  acetaminophen (TYLENOL) suppository 650 mg (has no administration in time range)  aspirin suppository 300 mg (has no administration in time range)    Or  aspirin tablet 325 mg (has no administration in time range)  ceFAZolin (ANCEF) 2-4 GM/100ML-% IVPB (has no administration in time range)  nitroGLYCERIN 100 mcg/mL intra-arterial injection (has no administration in time range)  iohexol (OMNIPAQUE) 350 MG/ML injection 40 mL (40 mLs Intravenous Contrast Given 04/04/21 0738)    ED Course  I have reviewed the triage vital signs  and the nursing notes.  Pertinent labs & imaging results that were available during my care of the patient were reviewed by me and considered in my medical decision making (see chart for details).    MDM Rules/Calculators/A&P                          Patient presents for possible IR intervention.  Neurology at the bedside and is taking to IR after CT perfusion.  Otherwise airway is stable, no trouble breathing.  Appears stable for transfer to the IR suite. Final Clinical Impression(s) / ED Diagnoses Final diagnoses:  Acute ischemic stroke [facility])    Rx / DC Orders ED Discharge Orders    None       Pricilla Loveless, MD 04/04/21 0800

## 2021-04-04 NOTE — Progress Notes (Signed)
  Echocardiogram 2D Echocardiogram has been performed.  Caleb Donaldson 04/04/2021, 11:47 AM

## 2021-04-05 ENCOUNTER — Inpatient Hospital Stay (HOSPITAL_COMMUNITY): Payer: Medicaid Other

## 2021-04-05 DIAGNOSIS — I6389 Other cerebral infarction: Secondary | ICD-10-CM

## 2021-04-05 DIAGNOSIS — I6602 Occlusion and stenosis of left middle cerebral artery: Secondary | ICD-10-CM

## 2021-04-05 LAB — CBC WITH DIFFERENTIAL/PLATELET
Abs Immature Granulocytes: 0.03 10*3/uL (ref 0.00–0.07)
Basophils Absolute: 0 10*3/uL (ref 0.0–0.1)
Basophils Relative: 0 %
Eosinophils Absolute: 0 10*3/uL (ref 0.0–0.5)
Eosinophils Relative: 0 %
HCT: 41.6 % (ref 39.0–52.0)
Hemoglobin: 13.4 g/dL (ref 13.0–17.0)
Immature Granulocytes: 0 %
Lymphocytes Relative: 16 %
Lymphs Abs: 1.6 10*3/uL (ref 0.7–4.0)
MCH: 30.7 pg (ref 26.0–34.0)
MCHC: 32.2 g/dL (ref 30.0–36.0)
MCV: 95.4 fL (ref 80.0–100.0)
Monocytes Absolute: 0.7 10*3/uL (ref 0.1–1.0)
Monocytes Relative: 7 %
Neutro Abs: 8 10*3/uL — ABNORMAL HIGH (ref 1.7–7.7)
Neutrophils Relative %: 77 %
Platelets: 177 10*3/uL (ref 150–400)
RBC: 4.36 MIL/uL (ref 4.22–5.81)
RDW: 12.5 % (ref 11.5–15.5)
WBC: 10.4 10*3/uL (ref 4.0–10.5)
nRBC: 0 % (ref 0.0–0.2)

## 2021-04-05 LAB — LIPID PANEL
Cholesterol: 176 mg/dL (ref 0–200)
HDL: 32 mg/dL — ABNORMAL LOW (ref >40)
LDL Cholesterol: 120 mg/dL — ABNORMAL HIGH (ref 0–99)
Total CHOL/HDL Ratio: 5.5 RATIO
Triglycerides: 121 mg/dL (ref <150)
VLDL: 24 mg/dL (ref 0–40)

## 2021-04-05 LAB — BASIC METABOLIC PANEL
Anion gap: 4 — ABNORMAL LOW (ref 5–15)
BUN: 8 mg/dL (ref 6–20)
CO2: 26 mmol/L (ref 22–32)
Calcium: 8.2 mg/dL — ABNORMAL LOW (ref 8.9–10.3)
Chloride: 112 mmol/L — ABNORMAL HIGH (ref 98–111)
Creatinine, Ser: 0.97 mg/dL (ref 0.61–1.24)
GFR, Estimated: >60 mL/min (ref >60)
Glucose, Bld: 104 mg/dL — ABNORMAL HIGH (ref 70–99)
Potassium: 4 mmol/L (ref 3.5–5.1)
Sodium: 142 mmol/L (ref 135–145)

## 2021-04-05 LAB — HEMOGLOBIN A1C
Hgb A1c MFr Bld: 5.7 % — ABNORMAL HIGH (ref 4.8–5.6)
Mean Plasma Glucose: 116.89 mg/dL

## 2021-04-05 MED ORDER — SODIUM CHLORIDE 0.9 % IV SOLN: INTRAVENOUS | Status: DC

## 2021-04-05 MED ORDER — PANTOPRAZOLE SODIUM 40 MG PO TBEC
40.0000 mg | DELAYED_RELEASE_TABLET | Freq: Every day | ORAL | Status: DC
  Administered 2021-04-06: 40 mg via ORAL
  Filled 2021-04-05: qty 1

## 2021-04-05 MED ORDER — PNEUMOCOCCAL VAC POLYVALENT 25 MCG/0.5ML IJ INJ
0.5000 mL | INJECTION | INTRAMUSCULAR | Status: DC
  Filled 2021-04-05: qty 0.5

## 2021-04-05 MED ORDER — LEVETIRACETAM 500 MG PO TABS
500.0000 mg | ORAL_TABLET | Freq: Two times a day (BID) | ORAL | Status: DC
  Administered 2021-04-05 – 2021-04-06 (×4): 500 mg via ORAL
  Filled 2021-04-05 (×4): qty 1

## 2021-04-05 NOTE — Progress Notes (Signed)
Referring Physician(s): Code stoke- Rejeana Brock (neurology)  Supervising Physician: Julieanne Cotton  Patient Status:  [facility] - In-pt  Chief Complaint:  History of acute CVA s/p cerebral arteriogram with emergent mechanical thrombectomy of left MCA occlusion achieving a TICI 3 revascularization via right femoral approach 04/04/2021 by Dr. Corliss Skains.  Subjective:  Patient awake and alert laying in bed. Complains of left-sided headache, rated 5/10 at this time. Denies vision changes, N/V associated with headache. Follows simple commands. Moving all extremities. Right femoral puncture site c/d/i.  MR/MRA brain/head today: 1. Oval 2.7 cm probable combined intra-axial and extra-axial hematoma centered at the Left insula/sylvian fissure(Heidelberg Classification 1C). Small volume superimposed left hemisphere subarachnoid hemorrhage, with no IVH. 2. Abnormal diffusion in the region of #1, but otherwise only scattered small cortical and subcortical infarcts are present in the lateral left hemisphere. 3. No significant intracranial mass effect.  No midline shift. 4. Patent Left MCA M1 with residual moderate to severe irregularity at the left MCA bi/trifurcation, most affecting the inferior M2 branch. Elsewhere negative intracranial MRA.   Allergies: Patient has no known allergies.  Medications: Prior to Admission medications   Not on File     Vital Signs: BP 111/67 (BP Location: Left Arm)   Pulse 60   Temp 97.6 F (36.4 C) (Oral)   Resp (!) 21   Ht  (1.626 m)   Wt 134 lb 0.6 oz (60.8 kg)   SpO2 97%   BMI 23.01 kg/m   Physical Exam Constitutional:      General: He is not in acute distress. Pulmonary:     Effort: Pulmonary effort is normal. No respiratory distress.  Skin:    General: Skin is warm and dry.     Comments: Right femoral puncture site stable without active bleeding or hematoma.  Neurological:     Mental Status: He is alert.     Comments:  Alert, awake, and oriented x3. Speech and comprehension intact. PERRL bilaterally. EOMs intact bilaterally without nystagmus or subjective diplopia. No facial asymmetry. Tongue midline. Can spontaneously move all extremities. No pronator drift. Fine motor and coordination intact and symmetric. Distal pulses (DPs) palpable bilaterally.     Imaging: CT HEAD WO CONTRAST  Addendum Date: 04/04/2021   ADDENDUM REPORT: 04/04/2021 19:43 ADDENDUM: Findings discussed with Dr. Cathie Hoops at 7:37 PM via telephone. Electronically Signed   By: Feliberto Harts MD   On: 04/04/2021 19:43   Result Date: 04/04/2021 CLINICAL DATA:  Stroke follow-up. EXAM: CT HEAD WITHOUT CONTRAST TECHNIQUE: Contiguous axial images were obtained from the base of the skull through the vertex without intravenous contrast. COMPARISON:  Limited CT scan performed postprocedural during thrombectomy on same day. Additional CT head code stroke and perfusion from the same day. FINDINGS: Brain: Status post left MCA thrombectomy. In comparison to immediate postprocedural CT scan, interval increase in bulging hyperdensity along the posterior aspect of left sylvian fissure that measures up to 2.6 cm. Overlying moderate volume subarachnoid hyperdensity along the left frontoparietal convexity. No specific evidence of acute large vascular territory infarct. No midline shift. Basal cisterns are patent. Vascular: Better evaluated on recent catheter arteriogram Skull: No acute fracture. Sinuses/Orbits: Please opacification of the right maxillary sinus. Mucosal thickening of scattered ethmoid air cells, left maxillary sinus and bilateral sphenoid sinuses. Other: No mastoid effusions. IMPRESSION: 1. Status post left MCA thrombectomy. In comparison to immediate postprocedural CT scan, interval increase in bulging hyperdensity along the posterior aspect of left sylvian fissure that measures up to  2.6 cm, raising concern for acute subarachnoid hemorrhage (with  possible intraparenchymal extension) over contrast staining. Overlying moderate volume subarachnoid hyperdensity along the left frontoparietal convexity may represent subarachnoid hemorrhage and/or contrast staining. 2. No specific evidence of acute large vascular territory infarct. MRI could provide more sensitive evaluation in this patient with small infarct seen on recent perfusion. Electronically Signed: By: Feliberto Harts MD On: 04/04/2021 19:37   MR ANGIO HEAD WO CONTRAST  Result Date: 04/05/2021 CLINICAL DATA:  55 year old male status post code stroke presentation yesterday with left M1 ELVO status post NIR with suspicion of acute hemorrhage on postprocedure CT. EXAM: MRI HEAD WITHOUT CONTRAST MRA HEAD WITHOUT CONTRAST TECHNIQUE: Multiplanar, multi-echo pulse sequences of the brain and surrounding structures were acquired without intravenous contrast. Angiographic images of the Circle of Willis were acquired using MRA technique without intravenous contrast. COMPARISON: No pertinent prior exam. COMPARISON:  Post left MCA thrombectomy CT 1556 hours yesterday, and earlier. FINDINGS: MRI HEAD FINDINGS Brain: Rounded area of intra-axial versus extra-axial mix signal intensity blood in the left sylvian fissure is oval measuring about 2.7 cm long axis, affecting the left insula. Mostly hyperintense T2 and decreased T1 signal intensity, susceptibility on SWI, and abnormal diffusion. Superimposed abnormal FLAIR hyperintensity within sulci of the left hemisphere, but no intraventricular blood is identified. On DWI there is abnormal diffusion and/or abnormal susceptibility associated with the left sylvian fissure lesion plus scattered small foci of cortical and subcortical white matter diffusion restriction from the level of the left operculum to the posterior left temporal lobe, junction with the inferior parietal lobe. Left basal ganglia largely spared. No contralateral right hemisphere or posterior fossa  restricted diffusion. No ventriculomegaly or midline shift. Widespread nonspecific patchy bilateral cerebral white matter T2 and FLAIR hyperintensity. Small chronic infarct in the posterior left cerebellum near the torcula. No other no chronic cortical encephalomalacia identified. Occasional punctate chronic microhemorrhages are possible. Basilar cisterns remain normal. Cervicomedullary junction and pituitary are within normal limits. Vascular: Major intracranial vascular flow voids are preserved. Skull and upper cervical spine: Negative. Sinuses/Orbits: Negative orbits soft tissues. Paranasal sinus fluid and mucosal thickening bilaterally worst in the right maxillary sinus. Other: Mastoids are well aerated. Negative visible scalp and face soft tissues. MRA HEAD FINDINGS Anterior circulation: Antegrade flow in the posterior circulation with mildly dominant distal left vertebral artery. Normal PICA origins and vertebrobasilar junction. Patent basilar artery, AICA and SCA origins. Normal right PCA origin. Fetal type left PCA origin. Bilateral PCA branches are within normal limits. Antegrade flow in both ICA siphons. Mild irregularity of the supraclinoid segments but no significant siphon stenosis. Ophthalmic and left posterior communicating artery origin are within normal limits. Patent carotid termini, MCA and ACA origins. Anterior communicating artery with a median artery of the corpus callosum is within normal limits. Right MCA M1, bifurcation and visible right MCA branches are within normal limits. Visible ACA branches are within normal limits. Left MCA M1 is patent. The left MCA bifurcation is patent but irregular as seen on series 1053, image 3. There is severe stenosis of the inferior left MCA M2 division (series 1059, image 6), but no discrete left MCA branch occlusion is identified. IMPRESSION: 1. Oval 2.7 cm probable combined intra-axial and extra-axial hematoma centered at the Left insula/sylvian  fissure(Heidelberg Classification 1C). Small volume superimposed left hemisphere subarachnoid hemorrhage, with no IVH. 2. Abnormal diffusion in the region of #1, but otherwise only scattered small cortical and subcortical infarcts are present in the lateral left hemisphere. 3. No  significant intracranial mass effect.  No midline shift. 4. Patent Left MCA M1 with residual moderate to severe irregularity at the left MCA bi/trifurcation, most affecting the inferior M2 branch. Elsewhere negative intracranial MRA. Electronically Signed   By: Odessa Fleming M.D.   On: 04/05/2021 06:46   MR BRAIN WO CONTRAST  Result Date: 04/05/2021 CLINICAL DATA:  55 year old male status post code stroke presentation yesterday with left M1 ELVO status post NIR with suspicion of acute hemorrhage on postprocedure CT. EXAM: MRI HEAD WITHOUT CONTRAST MRA HEAD WITHOUT CONTRAST TECHNIQUE: Multiplanar, multi-echo pulse sequences of the brain and surrounding structures were acquired without intravenous contrast. Angiographic images of the Circle of Willis were acquired using MRA technique without intravenous contrast. COMPARISON: No pertinent prior exam. COMPARISON:  Post left MCA thrombectomy CT 1556 hours yesterday, and earlier. FINDINGS: MRI HEAD FINDINGS Brain: Rounded area of intra-axial versus extra-axial mix signal intensity blood in the left sylvian fissure is oval measuring about 2.7 cm long axis, affecting the left insula. Mostly hyperintense T2 and decreased T1 signal intensity, susceptibility on SWI, and abnormal diffusion. Superimposed abnormal FLAIR hyperintensity within sulci of the left hemisphere, but no intraventricular blood is identified. On DWI there is abnormal diffusion and/or abnormal susceptibility associated with the left sylvian fissure lesion plus scattered small foci of cortical and subcortical white matter diffusion restriction from the level of the left operculum to the posterior left temporal lobe, junction with the  inferior parietal lobe. Left basal ganglia largely spared. No contralateral right hemisphere or posterior fossa restricted diffusion. No ventriculomegaly or midline shift. Widespread nonspecific patchy bilateral cerebral white matter T2 and FLAIR hyperintensity. Small chronic infarct in the posterior left cerebellum near the torcula. No other no chronic cortical encephalomalacia identified. Occasional punctate chronic microhemorrhages are possible. Basilar cisterns remain normal. Cervicomedullary junction and pituitary are within normal limits. Vascular: Major intracranial vascular flow voids are preserved. Skull and upper cervical spine: Negative. Sinuses/Orbits: Negative orbits soft tissues. Paranasal sinus fluid and mucosal thickening bilaterally worst in the right maxillary sinus. Other: Mastoids are well aerated. Negative visible scalp and face soft tissues. MRA HEAD FINDINGS Anterior circulation: Antegrade flow in the posterior circulation with mildly dominant distal left vertebral artery. Normal PICA origins and vertebrobasilar junction. Patent basilar artery, AICA and SCA origins. Normal right PCA origin. Fetal type left PCA origin. Bilateral PCA branches are within normal limits. Antegrade flow in both ICA siphons. Mild irregularity of the supraclinoid segments but no significant siphon stenosis. Ophthalmic and left posterior communicating artery origin are within normal limits. Patent carotid termini, MCA and ACA origins. Anterior communicating artery with a median artery of the corpus callosum is within normal limits. Right MCA M1, bifurcation and visible right MCA branches are within normal limits. Visible ACA branches are within normal limits. Left MCA M1 is patent. The left MCA bifurcation is patent but irregular as seen on series 1053, image 3. There is severe stenosis of the inferior left MCA M2 division (series 1059, image 6), but no discrete left MCA branch occlusion is identified. IMPRESSION: 1.  Oval 2.7 cm probable combined intra-axial and extra-axial hematoma centered at the Left insula/sylvian fissure(Heidelberg Classification 1C). Small volume superimposed left hemisphere subarachnoid hemorrhage, with no IVH. 2. Abnormal diffusion in the region of #1, but otherwise only scattered small cortical and subcortical infarcts are present in the lateral left hemisphere. 3. No significant intracranial mass effect.  No midline shift. 4. Patent Left MCA M1 with residual moderate to severe irregularity at the left  MCA bi/trifurcation, most affecting the inferior M2 branch. Elsewhere negative intracranial MRA. Electronically Signed   By: Odessa FlemingH  Hall M.D.   On: 04/05/2021 06:46   CT CEREBRAL PERFUSION W CONTRAST  Result Date: 04/04/2021 CLINICAL DATA:  Stroke follow-up.  Aphasia EXAM: CT PERFUSION BRAIN TECHNIQUE: Multiphase CT imaging of the brain was performed following IV bolus contrast injection. Subsequent parametric perfusion maps were calculated using RAPID software. CONTRAST:  40mL OMNIPAQUE IOHEXOL 350 MG/ML SOLN COMPARISON:  [facility] FINDINGS: CT Brain Perfusion Findings: CBF (<30%) Volume: 14mL Perfusion (Tmax>6.0s) volume: 93mL Mismatch Volume: 79mL ASPECTS on noncontrast CT Head: 10 on head CT performed at the same time Infarct Core: 14 cc mL Infarction Location: Lateral left frontal lobe. IMPRESSION: Good correlation with prior CTA and aspects. 79 cc of left MCA penumbra with a background of 14 cc core infarct. Electronically Signed   By: Marnee SpringJonathon  Watts M.D.   On: 04/04/2021 07:49   Portable Chest x-ray  Result Date: 04/04/2021 CLINICAL DATA:  ET tube placement. EXAM: PORTABLE CHEST 1 VIEW COMPARISON:  12/23/2012 FINDINGS: 1144 hours. Endotracheal tube tip is 4.2 cm above the base of the carina. The NG tube passes into the stomach although the distal tip position is not included on the film. The lungs are clear without focal pneumonia, edema,  pneumothorax or pleural effusion. The cardiopericardial silhouette is within normal limits for size. The visualized bony structures of the thorax show no acute abnormality. Telemetry leads overlie the chest. IMPRESSION: No active disease. Electronically Signed   By: Kennith CenterEric  Mansell M.D.   On: 04/04/2021 12:18   ECHOCARDIOGRAM COMPLETE  Result Date: 04/04/2021    ECHOCARDIOGRAM REPORT   Patient Name:   Orlene OchJERRY Kolk Date of Exam: 04/04/2021 Medical Rec #:  [id number]      Height: Accession #:    [id number]     Weight:       134.0 lb Date of Birth:  December 02, 1965      BSA:          1.546 m Patient Age:    54 years       BP:           140/96 mmHg Patient Gender: M              HR:           77 bpm. Exam Location:  Inpatient Procedure: 2D Echo Indications:    Hx of CVA  History:        Patient has no prior history of Echocardiogram examinations.  Referring Phys: [id number] MCNEILL P KIRKPATRICK IMPRESSIONS  1. Left ventricular ejection fraction, by estimation, is 55 to 60%. The left ventricle has normal function. The left ventricle has no regional wall motion abnormalities. Left ventricular diastolic parameters were normal.  2. Right ventricular systolic function is normal. The right ventricular size is normal.  3. Right atrial size was mildly dilated.  4. The mitral valve is normal in structure. Trivial mitral valve regurgitation.  5. The aortic valve is normal in structure. Aortic valve regurgitation is not visualized.  6. The inferior vena cava is normal in size with greater than 50% respiratory variability, suggesting right atrial pressure of 3 mmHg. FINDINGS  Left Ventricle: Left ventricular ejection fraction, by estimation, is 55 to 60%. The left ventricle has normal function. The left ventricle has no regional wall motion abnormalities. The left ventricular internal cavity size was normal in size. There is  no left ventricular hypertrophy. Left ventricular diastolic parameters were normal. Right Ventricle: The right  ventricular size is normal. Right vetricular wall thickness was not assessed. Right ventricular systolic function is normal. Left Atrium: Left atrial size was normal in size. Right Atrium: Right atrial size was mildly dilated. Pericardium: There is no evidence of pericardial effusion. Mitral Valve: The mitral valve is normal in structure. Trivial mitral valve regurgitation. Tricuspid Valve: The tricuspid valve is normal in structure. Tricuspid valve regurgitation is trivial. Aortic Valve: The aortic valve is normal in structure. Aortic valve regurgitation is not visualized. Pulmonic Valve: The pulmonic valve was not well visualized. Pulmonic valve regurgitation is not visualized. Aorta: The aortic root is normal in size and structure. Venous: The inferior vena cava is normal in size with greater than 50% respiratory variability, suggesting right atrial pressure of 3 mmHg. IAS/Shunts: No atrial level shunt detected by color flow Doppler.  LEFT VENTRICLE PLAX 2D LVIDd:         4.20 cm  Diastology LVIDs:         3.60 cm  LV e' medial:    9.68 cm/s LV PW:         1.00 cm  LV E/e' medial:  6.3 LV IVS:        0.70 cm  LV e' lateral:   12.50 cm/s LVOT diam:     1.90 cm  LV E/e' lateral: 4.9 LV SV:         51 LV SV Index:   33 LVOT Area:     2.84 cm  RIGHT VENTRICLE            IVC RV Basal diam:  3.10 cm    IVC diam: 1.50 cm RV Mid diam:    2.50 cm RV S prime:     9.46 cm/s TAPSE (M-mode): 1.6 cm LEFT ATRIUM             Index       RIGHT ATRIUM           Index LA diam:        2.60 cm 1.68 cm/m  RA Area:     11.80 cm LA Vol (A2C):   29.2 ml 18.88 ml/m RA Volume:   28.00 ml  18.11 ml/m LA Vol (A4C):   14.1 ml 9.12 ml/m LA Biplane Vol: 21.3 ml 13.77 ml/m  AORTIC VALVE LVOT Vmax:   74.40 cm/s LVOT Vmean:  52.100 cm/s LVOT VTI:    0.181 m  AORTA Ao Root diam: 3.10 cm Ao Asc diam:  2.70 cm MITRAL VALVE MV Area (PHT): 3.37 cm    SHUNTS MV Decel Time: 225 msec    Systemic VTI:  0.18 m MV E velocity: 61.30 cm/s  Systemic  Diam: 1.90 cm MV A velocity: 52.30 cm/s MV E/A ratio:  1.17 Dietrich Pates MD Electronically signed by Dietrich Pates MD Signature Date/Time: 04/04/2021/11:43:32 AM    Final    CT HEAD CODE STROKE WO CONTRAST  Result Date: 04/04/2021 CLINICAL DATA:  Code stroke.  Stroke follow-up EXAM: CT HEAD WITHOUT CONTRAST TECHNIQUE: Contiguous axial images were obtained from the base of the skull through the vertex without intravenous contrast. COMPARISON:  Earlier today FINDINGS: Brain: No evidence of acute infarction, hemorrhage, hydrocephalus, extra-axial collection or mass lesion/mass effect. Small remote left cerebellar infarct. Vascular: High-density vessels diffusely in the setting of remote cyst in IV contrast Skull: Normal. Negative for fracture or focal lesion. Sinuses/Orbits: Sinus opacification on a chronic or  recurrent basis, incidental to the history. Other: These results were called by telephone at the time of interpretation on 04/04/2021 at 7:41 am to provider MCNEILL [facility] , who verbally acknowledged these results. ASPECTS [facility] Stroke Program Early CT Score) - Ganglionic level infarction (caudate, lentiform nuclei, internal capsule, insula, M1-M3 cortex): 7 - Supraganglionic infarction (M4-M6 cortex): 3 Total score (0-10 with 10 being normal): 10 IMPRESSION: Stable head CT.  ASPECTS remains 10. Electronically Signed   By: Marnee Spring M.D.   On: 04/04/2021 07:42   VAS [place] LOWER EXTREMITY VENOUS (DVT)  Result Date: 04/04/2021  Lower Venous DVT Study Patient Name:  TORIE PRIEBE  Date of Exam:   04/04/2021 Medical Rec #: [id number]       Accession #:    [id number] Date of Birth: 01-11-1966       Patient Gender: M Patient Age:   54Y Exam Location:  [facility] Procedure:      VAS [place] LOWER EXTREMITY VENOUS (DVT) Referring Phys: [id number] Marvel Plan --------------------------------------------------------------------------------  Indications: Stroke.  Comparison Study: no prior Performing  Technologist: Blanch Media RVS  Examination Guidelines: A complete evaluation includes B-mode imaging, spectral Doppler, color Doppler, and power Doppler as needed of all accessible portions of each vessel. Bilateral testing is considered an integral part of a complete examination. Limited examinations for reoccurring indications may be performed as noted. The reflux portion of the exam is performed with the patient in reverse Trendelenburg.  +---------+---------------+---------+-----------+----------+--------------+ RIGHT    CompressibilityPhasicitySpontaneityPropertiesThrombus Aging +---------+---------------+---------+-----------+----------+--------------+ CFV      Full           Yes      Yes                                 +---------+---------------+---------+-----------+----------+--------------+ SFJ      Full                                                        +---------+---------------+---------+-----------+----------+--------------+ FV Prox  Full                                                        +---------+---------------+---------+-----------+----------+--------------+ FV Mid   Full                                                        +---------+---------------+---------+-----------+----------+--------------+ FV DistalFull                                                        +---------+---------------+---------+-----------+----------+--------------+ PFV      Full                                                        +---------+---------------+---------+-----------+----------+--------------+  POP      Full           Yes      Yes                                 +---------+---------------+---------+-----------+----------+--------------+ PTV      Full                                                        +---------+---------------+---------+-----------+----------+--------------+ PERO     Full                                                         +---------+---------------+---------+-----------+----------+--------------+   +---------+---------------+---------+-----------+----------+--------------+ LEFT     CompressibilityPhasicitySpontaneityPropertiesThrombus Aging +---------+---------------+---------+-----------+----------+--------------+ CFV      Full           Yes      Yes                                 +---------+---------------+---------+-----------+----------+--------------+ SFJ      Full                                                        +---------+---------------+---------+-----------+----------+--------------+ FV Prox  Full                                                        +---------+---------------+---------+-----------+----------+--------------+ FV Mid   Full                                                        +---------+---------------+---------+-----------+----------+--------------+ FV DistalFull                                                        +---------+---------------+---------+-----------+----------+--------------+ PFV      Full                                                        +---------+---------------+---------+-----------+----------+--------------+ POP      Full           Yes      Yes                                 +---------+---------------+---------+-----------+----------+--------------+  PTV      Full                                                        +---------+---------------+---------+-----------+----------+--------------+ PERO     Full                                                        +---------+---------------+---------+-----------+----------+--------------+     Summary: BILATERAL: - No evidence of deep vein thrombosis seen in the lower extremities, bilaterally. -No evidence of popliteal cyst, bilaterally.   *See table(s) above for measurements and observations. Electronically signed by Sherald Hess MD on  04/04/2021 at 4:58:45 PM.    Final     Labs:  CBC: Recent Labs    04/04/21 1020 04/05/21 0604  WBC  --  10.4  HGB 13.9 13.4  HCT 41.0 41.6  PLT  --  177    COAGS: No results for input(s): INR, APTT in the last 8760 hours.  BMP: Recent Labs    04/04/21 1020 04/05/21 0604  NA 141 142  K 4.0 4.0  CL  --  112*  CO2  --  26  GLUCOSE  --  104*  BUN  --  8  CALCIUM  --  8.2*  CREATININE  --  0.97  GFRNONAA  --  >60    LIVER FUNCTION TESTS: No results for input(s): BILITOT, AST, ALT, ALKPHOS, PROT, ALBUMIN in the last 8760 hours.  Assessment and Plan:  History of acute CVA s/p cerebral arteriogram with emergent mechanical thrombectomy of left MCA occlusion achieving a TICI 3 revascularization via right femoral approach 04/04/2021 by Dr. Corliss Skains. Patient awake/alert, A&Ox3, follows simple commands, moving all extremities. Right femoral puncture site stable, distal pulses (DPs) palpable bilaterally. Smoking cessation discussed with patient. Plan to follow-up with Dr. Corliss Skains in clinic 2 weeks after discharge ([facility] schedulers to call patient to set up this appointment). Further plans per neurology- appreciate and agree with management. Please call [facility] with questions/concerns.   Electronically Signed: Elwin Mocha, PA-C 04/05/2021, 9:40 AM   I spent a total of 15 Minutes at the the patient's bedside AND on the patient's hospital floor or unit, greater than 50% of which was counseling/coordinating care for CVA s/p revascularization.

## 2021-04-05 NOTE — Evaluation (Addendum)
Occupational Therapy Evaluation Patient Details Name: Caleb Donaldson MRN: [id number] DOB: [birth year] Today's Date: 04/05/2021    History of Present Illness Pt is a 55 year old s/p left MCA M1 thrombectomy, embolic stroke. No mass effect. PMHx: active smoker.   Clinical Impression   Pt PTA: Pt living with sister and reports independence. Pt currently, SupervisionA advised for ADL and mobility. Pt  scored >14 Short Blessed test indicating cognitive impairment. Pt's immediate recall is poor, but after 5 mins, pt recalled 4/5 items. Pt with executive function deficits and mild aphasia. Pt able to state portions of thoughts. Pt would benefit from continued OT skilled services. OT following acutely.    Follow Up Recommendations  Outpatient OT (cognition (could be addressed by SLP))    Equipment Recommendations  None recommended by OT    Recommendations for Other Services       Precautions / Restrictions Precautions Precautions: Fall Restrictions Weight Bearing Restrictions: No      Mobility Bed Mobility Overal bed mobility: Modified Independent                  Transfers Overall transfer level: Needs assistance Equipment used: None Transfers: Sit to/from Stand Sit to Stand: Supervision              Balance Overall balance assessment: Mild deficits observed, not formally tested                                         ADL either performed or assessed with clinical judgement   ADL Overall ADL's : At baseline                                       General ADL Comments: SupervisionA advised for ADL.     Vision Baseline Vision/History: No visual deficits;Wears glasses Wears Glasses: Reading only Patient Visual Report: No change from baseline Vision Assessment?: Yes Eye Alignment: Within Functional Limits Ocular Range of Motion: Within Functional Limits Alignment/Gaze Preference: Within Defined Limits     Perception      Praxis      Pertinent Vitals/Pain Pain Assessment: Faces Faces Pain Scale: Hurts little more Pain Location: headache Pain Descriptors / Indicators: Discomfort;Headache Pain Intervention(s): Monitored during session;Premedicated before session;Repositioned     Hand Dominance Right   Extremity/Trunk Assessment Upper Extremity Assessment Upper Extremity Assessment: Overall WFL for tasks assessed   Lower Extremity Assessment Lower Extremity Assessment: Overall WFL for tasks assessed   Cervical / Trunk Assessment Cervical / Trunk Assessment: Normal   Communication Communication Communication: No difficulties   Cognition Arousal/Alertness: Awake/alert Behavior During Therapy: WFL for tasks assessed/performed Overall Cognitive Status: Impaired/Different from baseline Area of Impairment: Memory;Awareness;Following commands;Problem solving                     Memory: Decreased short-term memory Following Commands: Follows multi-step commands inconsistently   Awareness: Anticipatory Problem Solving: Requires verbal cues General Comments: Pt with expressive aphasia, follows one step commands consistently, follows multi step commands inconsistently. Difficulty identifying button on call bell to notify nurse. pt stating "sister and brother" instead of daughter and son when introducing family members to OT Pt scored >14 Short Blessed test indicating cognitive impairment. Pt's immediate recall is poor, but after 5 mins, pt recalled 4/5 items.   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[employer], television/film/video Problem List: Decreased activity tolerance;Impaired vision/perception;Decreased cognition;Decreased safety awareness      OT Treatment/Interventions: Self-care/ADL training;Therapeutic activities;Cognitive remediation/compensation;Visual/perceptual remediation/compensation;Patient/family education    OT Goals(Current goals can be found in the care plan section) Acute Rehab OT Goals Patient Stated Goal: return home and to work when able OT Goal Formulation: With patient Time For Goal Achievement: 04/19/21 Potential to Achieve Goals: Good ADL Goals Additional ADL Goal #1: Pt will perform recall after 5 mins with 90% accuracy in 3/5 trials. Additional ADL Goal #2: Pt will perform higher level cognitive tasks x5 mins to continue to assess areas of impairment within ADL and mobility.  OT Frequency: Min 2X/week   Barriers to D/C:            Co-evaluation              AM-PAC OT "6 Clicks" Daily Activity     Outcome Measure Help from another person eating meals?: None Help from another person taking care of personal grooming?: A Little Help from another person toileting, which includes using toliet, bedpan, or urinal?: A Little Help from another person bathing (including washing, rinsing, drying)?: A Little Help from another person to put on and taking off regular upper body clothing?: None Help from another person to put on and taking off regular lower body clothing?: A Little 6 Click Score: 20   End of Session Equipment Utilized During Treatment: Gait belt Nurse Communication: Mobility status  Activity Tolerance: Patient tolerated treatment well Patient left: in chair;with call bell/phone within reach;with chair alarm set;with family/visitor present  OT Visit Diagnosis: Cognitive communication deficit (R41.841) Symptoms  and signs involving cognitive functions: Other cerebrovascular disease                Time: 1045-1105 OT Time Calculation (min): 20 min Charges:  OT General Charges $OT Visit: 1 Visit OT Evaluation $OT Eval Moderate Complexity: 1 Mod  Flora Lipps, OTR/L Acute Rehabilitation Services Pager: [phone] Office: [phone]  Valmai Vandenberghe C 04/05/2021, 5:41 PM

## 2021-04-05 NOTE — Evaluation (Signed)
Clinical/Bedside Swallow Evaluation Patient Details  Name: Caleb Donaldson MRN: [id number] Date of Birth: Mar 11, 1966  Today's Date: 04/05/2021 Time: SLP Start Time (ACUTE ONLY): 1007 SLP Stop Time (ACUTE ONLY): 1022 SLP Time Calculation (min) (ACUTE ONLY): 15 min  Past Medical History: History reviewed. No pertinent past medical history. Past Surgical History: History reviewed. No pertinent surgical history. HPI:  Caleb Donaldson is a 55 y.o. with a reported past medical history of smoking, who presented to [facility] on 5/16 and was found to have an acute M1 occlusion and significant aphasia. Underwent thrombectomy on 5/16 with revascularization of the L MCA (TICI 3).   Assessment / Plan / Recommendation Clinical Impression  Per RN,pt coughing during breakfast consuming toast and sausage. He is a current smoker and reports + intermittent coughing at baseline. Therapist did not detect cause for diet modification or instrumental assessment with thin, pills with thin (with RN), puree or solid texture. Complete oral clearance with intact labial/lingual cranial nerve function. Intermittent cough suspected due to baseline cough possibly impacted by dry textures. Recommend continue regular/thin liquids. No follow up needed for swallow. SLP Visit Diagnosis: Dysphagia, unspecified (R13.10)    Aspiration Risk  Mild aspiration risk    Diet Recommendation Regular;Thin liquid   Liquid Administration via: Cup;Straw Medication Administration: Whole meds with liquid Supervision: Patient able to self feed Postural Changes: Seated upright at 90 degrees    Other  Recommendations Oral Care Recommendations: Oral care BID   Follow up Recommendations None      Frequency and Duration            Prognosis        Swallow Study   General Date of Onset: 04/04/21 HPI: Caleb Donaldson is a 55 y.o. with a reported past medical history of smoking, who presented to [place] on 5/16 and was found to have an  acute M1 occlusion and significant aphasia. Underwent thrombectomy on 5/16 with revascularization of the L MCA (TICI 3). Type of Study: Bedside Swallow Evaluation Previous Swallow Assessment:  (none) Diet Prior to this Study: Regular;Thin liquids Temperature Spikes Noted: No Respiratory Status: Room air History of Recent Intubation: Yes Length of Intubations (days):  (for procedure) Date extubated: 04/04/21 Behavior/Cognition: Alert;Cooperative;Pleasant mood Oral Cavity Assessment: Within Functional Limits Oral Care Completed by SLP: No Oral Cavity - Dentition: Dentures, top;Other (Comment) (no lower) Vision: Functional for self-feeding Self-Feeding Abilities: Able to feed self Patient Positioning: Upright in bed Baseline Vocal Quality: Normal Volitional Cough: Strong Volitional Swallow: Able to elicit    Oral/Motor/Sensory Function Overall Oral Motor/Sensory Function: Within functional limits   Ice Chips Ice chips: Not tested   Thin Liquid Thin Liquid: Within functional limits Presentation: Cup;Straw    Nectar Thick Nectar Thick Liquid: Not tested   Honey Thick Honey Thick Liquid: Not tested   Puree Puree: Within functional limits   Solid     Solid: Within functional limits      Royce Macadamia 04/05/2021,10:53 AM  Breck Coons Lonell Face.Ed Nurse, children's [phone] Office [phone]

## 2021-04-05 NOTE — Progress Notes (Signed)
    [facility] has been requested to perform a transesophageal echocardiogram on Caleb Donaldson for CVA.  After careful review of history and examination, the risks and benefits of transesophageal echocardiogram have been explained including risks of esophageal damage, perforation (1:10,000 risk), bleeding, pharyngeal hematoma as well as other potential complications associated with conscious sedation including aspiration, arrhythmia, respiratory failure and death. Alternatives to treatment were discussed, questions were answered. Patient is willing to proceed.  TEE - Dr. Eden Emms @ 1330 . NPO after midnight. Meds with sips.   Manson Passey, PA-C 04/05/2021 3:24 PM

## 2021-04-05 NOTE — Progress Notes (Signed)
STROKE TEAM PROGRESS NOTE   SUBJECTIVE (INTERVAL HISTORY) His RN and children are at the bedside. Pt extubated last night, now sitting in bed, awake alert and conversing well, moving all extremities equally. 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 He passed swallow on diet.  Smoking cessation education provided.  Discussed about the TEE, will do tomorrow.  OBJECTIVE Temp:  [97.5 F (36.4 C)-99.2 F (37.3 C)] 97.9 F (36.6 C) (05/17 1200) Pulse Rate:  [52-88] 83 (05/17 1200) Cardiac Rhythm: Normal sinus rhythm (05/17 0800) Resp:  [15-24] 20 (05/17 1200) BP: (91-119)/(54-86) 100/65 (05/17 1200) SpO2:  [93 %-100 %] 96 % (05/17 1200) Arterial Line BP: (63-117)/(54-69) 63/54 (05/16 1700) FiO2 (%):  [40 %] 40 % (05/16 1516)  Recent Labs  Lab 04/04/21 0727  GLUCAP 99   Recent Labs  Lab 04/04/21 1020 04/05/21 0604  NA 141 142  K 4.0 4.0  CL  --  112*  CO2  --  26  GLUCOSE  --  104*  BUN  --  8  CREATININE  --  0.97  CALCIUM  --  8.2*   No results for input(s): AST, ALT, ALKPHOS, BILITOT, PROT, ALBUMIN in the last 168 hours. Recent Labs  Lab 04/04/21 1020 04/05/21 0604  WBC  --  10.4  NEUTROABS  --  8.0*  HGB 13.9 13.4  HCT 41.0 41.6  MCV  --  95.4  PLT  --  177   No results for input(s): CKTOTAL, CKMB, CKMBINDEX, TROPONINI in the last 168 hours. No results for input(s): LABPROT, INR in the last 72 hours. No results for input(s): COLORURINE, LABSPEC, PHURINE, GLUCOSEU, HGBUR, BILIRUBINUR, KETONESUR, PROTEINUR, UROBILINOGEN, NITRITE, LEUKOCYTESUR in the last 72 hours.  Invalid input(s): APPERANCEUR     Component Value Date/Time   CHOL 176 04/05/2021 0604   TRIG 121 04/05/2021 0604   HDL 32 (L) 04/05/2021 0604   CHOLHDL 5.5 04/05/2021 0604   VLDL 24 04/05/2021 0604   LDLCALC 120 (H) 04/05/2021 0604   Lab Results  Component Value Date   HGBA1C 5.7 (H) 04/05/2021      Component Value Date/Time   LABOPIA  NONE DETECTED 04/04/2021 0754   COCAINSCRNUR NONE DETECTED 04/04/2021 0754   LABBENZ NONE DETECTED 04/04/2021 0754   AMPHETMU NONE DETECTED 04/04/2021 0754   THCU POSITIVE (A) 04/04/2021 0754   LABBARB NONE DETECTED 04/04/2021 0754    No results for input(s): ETH in the last 168 hours.  I have personally reviewed the radiological images below and agree with the radiology interpretations.  CT HEAD WO CONTRAST  Addendum Date: 04/04/2021   ADDENDUM REPORT: 04/04/2021 19:43 ADDENDUM: Findings discussed with Dr. Cathie Hoops at 7:37 PM via telephone. Electronically Signed   By: Feliberto Harts MD   On: 04/04/2021 19:43   Result Date: 04/04/2021 CLINICAL DATA:  Stroke follow-up. EXAM: CT HEAD WITHOUT CONTRAST TECHNIQUE: Contiguous axial images were obtained from the base of the skull through the vertex without intravenous contrast. COMPARISON:  Limited CT scan performed postprocedural during thrombectomy on same day. Additional CT head code stroke and perfusion from the same day. FINDINGS: Brain: Status post left MCA thrombectomy. In comparison to immediate postprocedural CT scan, interval increase in bulging hyperdensity along the posterior aspect of left sylvian fissure that measures up to 2.6 cm. Overlying moderate volume subarachnoid hyperdensity along the left frontoparietal convexity. No specific evidence of acute large vascular territory infarct. No midline shift. Basal cisterns are patent. Vascular: Better  evaluated on recent catheter arteriogram Skull: No acute fracture. Sinuses/Orbits: Please opacification of the right maxillary sinus. Mucosal thickening of scattered ethmoid air cells, left maxillary sinus and bilateral sphenoid sinuses. Other: No mastoid effusions. IMPRESSION: 1. Status post left MCA thrombectomy. In comparison to immediate postprocedural CT scan, interval increase in bulging hyperdensity along the posterior aspect of left sylvian fissure that measures up to 2.6 cm, raising concern for  acute subarachnoid hemorrhage (with possible intraparenchymal extension) over contrast staining. Overlying moderate volume subarachnoid hyperdensity along the left frontoparietal convexity may represent subarachnoid hemorrhage and/or contrast staining. 2. No specific evidence of acute large vascular territory infarct. MRI could provide more sensitive evaluation in this patient with small infarct seen on recent perfusion. Electronically Signed: By: Feliberto Harts MD On: 04/04/2021 19:37   MR ANGIO HEAD WO CONTRAST  Result Date: 04/05/2021 CLINICAL DATA:  55 year old male status post code stroke presentation yesterday with left M1 ELVO status post NIR with suspicion of acute hemorrhage on postprocedure CT. EXAM: MRI HEAD WITHOUT CONTRAST MRA HEAD WITHOUT CONTRAST TECHNIQUE: Multiplanar, multi-echo pulse sequences of the brain and surrounding structures were acquired without intravenous contrast. Angiographic images of the Circle of Willis were acquired using MRA technique without intravenous contrast. COMPARISON: No pertinent prior exam. COMPARISON:  Post left MCA thrombectomy CT 1556 hours yesterday, and earlier. FINDINGS: MRI HEAD FINDINGS Brain: Rounded area of intra-axial versus extra-axial mix signal intensity blood in the left sylvian fissure is oval measuring about 2.7 cm long axis, affecting the left insula. Mostly hyperintense T2 and decreased T1 signal intensity, susceptibility on SWI, and abnormal diffusion. Superimposed abnormal FLAIR hyperintensity within sulci of the left hemisphere, but no intraventricular blood is identified. On DWI there is abnormal diffusion and/or abnormal susceptibility associated with the left sylvian fissure lesion plus scattered small foci of cortical and subcortical white matter diffusion restriction from the level of the left operculum to the posterior left temporal lobe, junction with the inferior parietal lobe. Left basal ganglia largely spared. No contralateral right  hemisphere or posterior fossa restricted diffusion. No ventriculomegaly or midline shift. Widespread nonspecific patchy bilateral cerebral white matter T2 and FLAIR hyperintensity. Small chronic infarct in the posterior left cerebellum near the torcula. No other no chronic cortical encephalomalacia identified. Occasional punctate chronic microhemorrhages are possible. Basilar cisterns remain normal. Cervicomedullary junction and pituitary are within normal limits. Vascular: Major intracranial vascular flow voids are preserved. Skull and upper cervical spine: Negative. Sinuses/Orbits: Negative orbits soft tissues. Paranasal sinus fluid and mucosal thickening bilaterally worst in the right maxillary sinus. Other: Mastoids are well aerated. Negative visible scalp and face soft tissues. MRA HEAD FINDINGS Anterior circulation: Antegrade flow in the posterior circulation with mildly dominant distal left vertebral artery. Normal PICA origins and vertebrobasilar junction. Patent basilar artery, AICA and SCA origins. Normal right PCA origin. Fetal type left PCA origin. Bilateral PCA branches are within normal limits. Antegrade flow in both ICA siphons. Mild irregularity of the supraclinoid segments but no significant siphon stenosis. Ophthalmic and left posterior communicating artery origin are within normal limits. Patent carotid termini, MCA and ACA origins. Anterior communicating artery with a median artery of the corpus callosum is within normal limits. Right MCA M1, bifurcation and visible right MCA branches are within normal limits. Visible ACA branches are within normal limits. Left MCA M1 is patent. The left MCA bifurcation is patent but irregular as seen on series 1053, image 3. There is severe stenosis of the inferior left MCA M2 division (series 1059, image  6), but no discrete left MCA branch occlusion is identified. IMPRESSION: 1. Oval 2.7 cm probable combined intra-axial and extra-axial hematoma centered at the  Left insula/sylvian fissure(Heidelberg Classification 1C). Small volume superimposed left hemisphere subarachnoid hemorrhage, with no IVH. 2. Abnormal diffusion in the region of #1, but otherwise only scattered small cortical and subcortical infarcts are present in the lateral left hemisphere. 3. No significant intracranial mass effect.  No midline shift. 4. Patent Left MCA M1 with residual moderate to severe irregularity at the left MCA bi/trifurcation, most affecting the inferior M2 branch. Elsewhere negative intracranial MRA. Electronically Signed   By: Odessa Fleming M.D.   On: 04/05/2021 06:46   MR BRAIN WO CONTRAST  Result Date: 04/05/2021 CLINICAL DATA:  55 year old male status post code stroke presentation yesterday with left M1 ELVO status post NIR with suspicion of acute hemorrhage on postprocedure CT. EXAM: MRI HEAD WITHOUT CONTRAST MRA HEAD WITHOUT CONTRAST TECHNIQUE: Multiplanar, multi-echo pulse sequences of the brain and surrounding structures were acquired without intravenous contrast. Angiographic images of the Circle of Willis were acquired using MRA technique without intravenous contrast. COMPARISON: No pertinent prior exam. COMPARISON:  Post left MCA thrombectomy CT 1556 hours yesterday, and earlier. FINDINGS: MRI HEAD FINDINGS Brain: Rounded area of intra-axial versus extra-axial mix signal intensity blood in the left sylvian fissure is oval measuring about 2.7 cm long axis, affecting the left insula. Mostly hyperintense T2 and decreased T1 signal intensity, susceptibility on SWI, and abnormal diffusion. Superimposed abnormal FLAIR hyperintensity within sulci of the left hemisphere, but no intraventricular blood is identified. On DWI there is abnormal diffusion and/or abnormal susceptibility associated with the left sylvian fissure lesion plus scattered small foci of cortical and subcortical white matter diffusion restriction from the level of the left operculum to the posterior left temporal lobe,  junction with the inferior parietal lobe. Left basal ganglia largely spared. No contralateral right hemisphere or posterior fossa restricted diffusion. No ventriculomegaly or midline shift. Widespread nonspecific patchy bilateral cerebral white matter T2 and FLAIR hyperintensity. Small chronic infarct in the posterior left cerebellum near the torcula. No other no chronic cortical encephalomalacia identified. Occasional punctate chronic microhemorrhages are possible. Basilar cisterns remain normal. Cervicomedullary junction and pituitary are within normal limits. Vascular: Major intracranial vascular flow voids are preserved. Skull and upper cervical spine: Negative. Sinuses/Orbits: Negative orbits soft tissues. Paranasal sinus fluid and mucosal thickening bilaterally worst in the right maxillary sinus. Other: Mastoids are well aerated. Negative visible scalp and face soft tissues. MRA HEAD FINDINGS Anterior circulation: Antegrade flow in the posterior circulation with mildly dominant distal left vertebral artery. Normal PICA origins and vertebrobasilar junction. Patent basilar artery, AICA and SCA origins. Normal right PCA origin. Fetal type left PCA origin. Bilateral PCA branches are within normal limits. Antegrade flow in both ICA siphons. Mild irregularity of the supraclinoid segments but no significant siphon stenosis. Ophthalmic and left posterior communicating artery origin are within normal limits. Patent carotid termini, MCA and ACA origins. Anterior communicating artery with a median artery of the corpus callosum is within normal limits. Right MCA M1, bifurcation and visible right MCA branches are within normal limits. Visible ACA branches are within normal limits. Left MCA M1 is patent. The left MCA bifurcation is patent but irregular as seen on series 1053, image 3. There is severe stenosis of the inferior left MCA M2 division (series 1059, image 6), but no discrete left MCA branch occlusion is  identified. IMPRESSION: 1. Oval 2.7 cm probable combined intra-axial and extra-axial hematoma  centered at the Left insula/sylvian fissure(Heidelberg Classification 1C). Small volume superimposed left hemisphere subarachnoid hemorrhage, with no IVH. 2. Abnormal diffusion in the region of #1, but otherwise only scattered small cortical and subcortical infarcts are present in the lateral left hemisphere. 3. No significant intracranial mass effect.  No midline shift. 4. Patent Left MCA M1 with residual moderate to severe irregularity at the left MCA bi/trifurcation, most affecting the inferior M2 branch. Elsewhere negative intracranial MRA. Electronically Signed   By: Odessa Fleming M.D.   On: 04/05/2021 06:46   CT CEREBRAL PERFUSION W CONTRAST  Result Date: 04/04/2021 CLINICAL DATA:  Stroke follow-up.  Aphasia EXAM: CT PERFUSION BRAIN TECHNIQUE: Multiphase CT imaging of the brain was performed following IV bolus contrast injection. Subsequent parametric perfusion maps were calculated using RAPID software. CONTRAST:  49mL OMNIPAQUE IOHEXOL 350 MG/ML SOLN COMPARISON:  CTA of the head neck from earlier today at [facility] FINDINGS: CT Brain Perfusion Findings: CBF (<30%) Volume: 19mL Perfusion (Tmax>6.0s) volume: 4mL Mismatch Volume: 14mL ASPECTS on noncontrast CT Head: 10 on head CT performed at the same time Infarct Core: 14 cc mL Infarction Location: Lateral left frontal lobe. IMPRESSION: Good correlation with prior CTA and aspects. 79 cc of left MCA penumbra with a background of 14 cc core infarct. Electronically Signed   By: Marnee Spring M.D.   On: 04/04/2021 07:49   Portable Chest x-ray  Result Date: 04/04/2021 CLINICAL DATA:  ET tube placement. EXAM: PORTABLE CHEST 1 VIEW COMPARISON:  12/23/2012 FINDINGS: 1144 hours. Endotracheal tube tip is 4.2 cm above the base of the carina. The NG tube passes into the stomach although the distal tip position is not included on the film. The lungs are clear without  focal pneumonia, edema, pneumothorax or pleural effusion. The cardiopericardial silhouette is within normal limits for size. The visualized bony structures of the thorax show no acute abnormality. Telemetry leads overlie the chest. IMPRESSION: No active disease. Electronically Signed   By: Kennith Center M.D.   On: 04/04/2021 12:18   ECHOCARDIOGRAM COMPLETE  Result Date: 04/04/2021    ECHOCARDIOGRAM REPORT   Patient Name:   Caleb Donaldson Date of Exam: 04/04/2021 Medical Rec #:  [id number]      Height: Accession #:    [id number]     Weight:       134.0 lb Date of Birth:  1966-08-06      BSA:          1.546 m Patient Age:    54 years       BP:           140/96 mmHg Patient Gender: M              HR:           77 bpm. Exam Location:  Inpatient Procedure: 2D Echo Indications:    Hx of CVA  History:        Patient has no prior history of Echocardiogram examinations.  Referring Phys: [id number] MCNEILL P KIRKPATRICK IMPRESSIONS  1. Left ventricular ejection fraction, by estimation, is 55 to 60%. The left ventricle has normal function. The left ventricle has no regional wall motion abnormalities. Left ventricular diastolic parameters were normal.  2. Right ventricular systolic function is normal. The right ventricular size is normal.  3. Right atrial size was mildly dilated.  4. The mitral valve is normal in structure. Trivial mitral valve regurgitation.  5. The aortic valve is normal in structure. Aortic valve regurgitation is not  visualized.  6. The inferior vena cava is normal in size with greater than 50% respiratory variability, suggesting right atrial pressure of 3 mmHg. FINDINGS  Left Ventricle: Left ventricular ejection fraction, by estimation, is 55 to 60%. The left ventricle has normal function. The left ventricle has no regional wall motion abnormalities. The left ventricular internal cavity size was normal in size. There is  no left ventricular hypertrophy. Left ventricular diastolic parameters were normal. Right  Ventricle: The right ventricular size is normal. Right vetricular wall thickness was not assessed. Right ventricular systolic function is normal. Left Atrium: Left atrial size was normal in size. Right Atrium: Right atrial size was mildly dilated. Pericardium: There is no evidence of pericardial effusion. Mitral Valve: The mitral valve is normal in structure. Trivial mitral valve regurgitation. Tricuspid Valve: The tricuspid valve is normal in structure. Tricuspid valve regurgitation is trivial. Aortic Valve: The aortic valve is normal in structure. Aortic valve regurgitation is not visualized. Pulmonic Valve: The pulmonic valve was not well visualized. Pulmonic valve regurgitation is not visualized. Aorta: The aortic root is normal in size and structure. Venous: The inferior vena cava is normal in size with greater than 50% respiratory variability, suggesting right atrial pressure of 3 mmHg. IAS/Shunts: No atrial level shunt detected by color flow Doppler.  LEFT VENTRICLE PLAX 2D LVIDd:         4.20 cm  Diastology LVIDs:         3.60 cm  LV e' medial:    9.68 cm/s LV PW:         1.00 cm  LV E/e' medial:  6.3 LV IVS:        0.70 cm  LV e' lateral:   12.50 cm/s LVOT diam:     1.90 cm  LV E/e' lateral: 4.9 LV SV:         51 LV SV Index:   33 LVOT Area:     2.84 cm  RIGHT VENTRICLE            IVC RV Basal diam:  3.10 cm    IVC diam: 1.50 cm RV Mid diam:    2.50 cm RV S prime:     9.46 cm/s TAPSE (M-mode): 1.6 cm LEFT ATRIUM             Index       RIGHT ATRIUM           Index LA diam:        2.60 cm 1.68 cm/m  RA Area:     11.80 cm LA Vol (A2C):   29.2 ml 18.88 ml/m RA Volume:   28.00 ml  18.11 ml/m LA Vol (A4C):   14.1 ml 9.12 ml/m LA Biplane Vol: 21.3 ml 13.77 ml/m  AORTIC VALVE LVOT Vmax:   74.40 cm/s LVOT Vmean:  52.100 cm/s LVOT VTI:    0.181 m  AORTA Ao Root diam: 3.10 cm Ao Asc diam:  2.70 cm MITRAL VALVE MV Area (PHT): 3.37 cm    SHUNTS MV Decel Time: 225 msec    Systemic VTI:  0.18 m MV E velocity:  61.30 cm/s  Systemic Diam: 1.90 cm MV A velocity: 52.30 cm/s MV E/A ratio:  1.17 Dietrich Pates MD Electronically signed by Dietrich Pates MD Signature Date/Time: 04/04/2021/11:43:32 AM    Final    CT HEAD CODE STROKE WO CONTRAST  Result Date: 04/04/2021 CLINICAL DATA:  Code stroke.  Stroke follow-up EXAM: CT HEAD WITHOUT CONTRAST TECHNIQUE: Contiguous axial images  were obtained from the base of the skull through the vertex without intravenous contrast. COMPARISON:  Earlier today FINDINGS: Brain: No evidence of acute infarction, hemorrhage, hydrocephalus, extra-axial collection or mass lesion/mass effect. Small remote left cerebellar infarct. Vascular: High-density vessels diffusely in the setting of remote cyst in IV contrast Skull: Normal. Negative for fracture or focal lesion. Sinuses/Orbits: Sinus opacification on a chronic or recurrent basis, incidental to the history. Other: These results were called by telephone at the time of interpretation on 04/04/2021 at 7:41 am to provider MCNEILL [facility] , who verbally acknowledged these results. ASPECTS [facility] Stroke Program Early CT Score) - Ganglionic level infarction (caudate, lentiform nuclei, internal capsule, insula, M1-M3 cortex): 7 - Supraganglionic infarction (M4-M6 cortex): 3 Total score (0-10 with 10 being normal): 10 IMPRESSION: Stable head CT.  ASPECTS remains 10. Electronically Signed   By: Marnee Spring M.D.   On: 04/04/2021 07:42   VAS [place] LOWER EXTREMITY VENOUS (DVT)  Result Date: 04/04/2021  Lower Venous DVT Study Patient Name:  JAQUES MINEER  Date of Exam:   04/04/2021 Medical Rec #: [id number]       Accession #:    [id number] Date of Birth: 1966/11/19       Patient Gender: M Patient Age:   54Y Exam Location:  [facility] Procedure:      VAS [place] LOWER EXTREMITY VENOUS (DVT) Referring Phys: [id number] Marvel Plan --------------------------------------------------------------------------------  Indications: Stroke.  Comparison Study: no prior  Performing Technologist: Blanch Media RVS  Examination Guidelines: A complete evaluation includes B-mode imaging, spectral Doppler, color Doppler, and power Doppler as needed of all accessible portions of each vessel. Bilateral testing is considered an integral part of a complete examination. Limited examinations for reoccurring indications may be performed as noted. The reflux portion of the exam is performed with the patient in reverse Trendelenburg.  +---------+---------------+---------+-----------+----------+--------------+ RIGHT    CompressibilityPhasicitySpontaneityPropertiesThrombus Aging +---------+---------------+---------+-----------+----------+--------------+ CFV      Full           Yes      Yes                                 +---------+---------------+---------+-----------+----------+--------------+ SFJ      Full                                                        +---------+---------------+---------+-----------+----------+--------------+ FV Prox  Full                                                        +---------+---------------+---------+-----------+----------+--------------+ FV Mid   Full                                                        +---------+---------------+---------+-----------+----------+--------------+ FV DistalFull                                                        +---------+---------------+---------+-----------+----------+--------------+  PFV      Full                                                        +---------+---------------+---------+-----------+----------+--------------+ POP      Full           Yes      Yes                                 +---------+---------------+---------+-----------+----------+--------------+ PTV      Full                                                        +---------+---------------+---------+-----------+----------+--------------+ PERO     Full                                                         +---------+---------------+---------+-----------+----------+--------------+   +---------+---------------+---------+-----------+----------+--------------+ LEFT     CompressibilityPhasicitySpontaneityPropertiesThrombus Aging +---------+---------------+---------+-----------+----------+--------------+ CFV      Full           Yes      Yes                                 +---------+---------------+---------+-----------+----------+--------------+ SFJ      Full                                                        +---------+---------------+---------+-----------+----------+--------------+ FV Prox  Full                                                        +---------+---------------+---------+-----------+----------+--------------+ FV Mid   Full                                                        +---------+---------------+---------+-----------+----------+--------------+ FV DistalFull                                                        +---------+---------------+---------+-----------+----------+--------------+ PFV      Full                                                        +---------+---------------+---------+-----------+----------+--------------+  POP      Full           Yes      Yes                                 +---------+---------------+---------+-----------+----------+--------------+ PTV      Full                                                        +---------+---------------+---------+-----------+----------+--------------+ PERO     Full                                                        +---------+---------------+---------+-----------+----------+--------------+     Summary: BILATERAL: - No evidence of deep vein thrombosis seen in the lower extremities, bilaterally. -No evidence of popliteal cyst, bilaterally.   *See table(s) above for measurements and observations. Electronically signed by Sherald Hess MD on 04/04/2021 at 4:58:45 PM.    Final     PHYSICAL EXAM  Temp:  [97.5 F (36.4 C)-99.2 F (37.3 C)] 97.9 F (36.6 C) (05/17 1200) Pulse Rate:  [52-88] 83 (05/17 1200) Resp:  [15-24] 20 (05/17 1200) BP: (91-119)/(54-86) 100/65 (05/17 1200) SpO2:  [93 %-100 %] 96 % (05/17 1200) Arterial Line BP: (63-117)/(54-69) 63/54 (05/16 1700) FiO2 (%):  [40 %] 40 % (05/16 1516)  General - Well nourished, well developed, in no apparent distress.  Ophthalmologic - fundi not visualized due to noncooperation.  Cardiovascular - Regular rhythm and rate.  Mental Status -  Level of arousal and orientation to time, place, and person were intact. Language including expression, naming, repetition, comprehension was assessed and found intact. Minimal intermittent paraphasic errors Fund of Knowledge was assessed and was intact.  Cranial Nerves II - XII - II - Visual field intact OU. III, IV, VI - Extraocular movements intact. V - Facial sensation intact bilaterally. VII - Facial movement intact bilaterally. VIII - Hearing & vestibular intact bilaterally. X - Palate elevates symmetrically. XI - Chin turning & shoulder shrug intact bilaterally. XII - Tongue protrusion intact.  Motor Strength - The patient's strength was normal in all extremities and pronator drift was absent.  Bulk was normal and fasciculations were absent.   Motor Tone - Muscle tone was assessed at the neck and appendages and was normal.  Reflexes - The patient's reflexes were symmetrical in all extremities and he had no pathological reflexes.  Sensory - Light touch, temperature/pinprick were assessed and were symmetrical.    Coordination - The patient had normal movements in the hands with no ataxia or dysmetria.  Tremor was absent.  Gait and Station - deferred.    ASSESSMENT/PLAN Mr. Caleb Donaldson is a 55 y.o. male with history of current smoker admitted for aphasia. No tPA given due to outside window.    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  MRA patent left M1 now but high grade stenosis of left M2 inferior branch  2D Echo EF 55 to 60%  LE venous Doppler no DVT  TEE pending in am  LDL 120  HgbA1c 5.7  SCDs for VTE prophylaxis  No antithrombotic prior to admission, now on no antithrombotics due to hemorrhagic conversion  Ongoing aggressive stroke risk factor management  Therapy recommendations: Pending  Disposition: Pending  Hypertension . Stable now . BP goal < 160 . Off Cleviprex  Long term BP goal normotensive  Hyperlipidemia  Home meds: None  LDL 120, goal < 70  Now on Lipitor 40  Continue statin at discharge  Tobacco abuse  Current heavy smoker  Smoking cessation counseling provided  Pt refused nicotine patch - wants "cold [place]"  Pt is willing to quit  Other Stroke Risk Factors  UDS positive for THC  Other Active Problems  [facility]  [facility] day # 1  This patient is critically ill due to left MCA stroke, left M1 occlusion status post thrombectomy, hemorrhagic conversion with hematoma, SAH and at significant risk of neurological worsening, death form cerebral edema, hematoma expansion, cerebral vasospasm, recurrent stroke. This patient's care requires constant monitoring of vital signs, hemodynamics, respiratory and cardiac monitoring, review of multiple databases, neurological assessment, discussion with  family, other specialists and medical decision making of high complexity. I spent 40 minutes of neurocritical care time in the care of this patient. I had long discussion with daughter and son at bedside, updated pt current condition, treatment plan and potential prognosis, and answered all the questions.  They expressed understanding and appreciation.    Marvel PlanJindong Aren Pryde, MD PhD Stroke Neurology 04/05/2021 2:00 PM    To contact Stroke Continuity provider, please refer to [email or web address]. After hours, contact General Neurology

## 2021-04-05 NOTE — Evaluation (Addendum)
Speech Language Pathology Evaluation Patient Details Name: Caleb Donaldson MRN: [id number] DOB: [birth year] Today's Date: 04/05/2021 Time: 7829-5621 SLP Time Calculation (min) (ACUTE ONLY): 15 min  Problem List:  Patient Active Problem List   Diagnosis Date Noted  . Cerebral embolism with cerebral infarction 04/04/2021  . Stroke (cerebrum) (HCC) 04/04/2021  . Middle cerebral artery embolism, left 04/04/2021   Past Medical History: History reviewed. No pertinent past medical history. Past Surgical History: History reviewed. No pertinent surgical history. HPI:  Caleb Donaldson is a 55 y.o. with a reported past medical history of smoking, who presented to [place] on 5/16 and was found to have an acute M1 occlusion and significant aphasia. Underwent thrombectomy on 5/16 with revascularization of the L MCA (TICI 3).   Assessment / Plan / Recommendation Clinical Impression   Pt's exhibits min-mild aphasia and executive functioning deficits. His language is fluent marked by occasional phonemic paraphasia without awareness. Oral-motor and motor speech are intact. 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 He was given the [employer] and scored a 17/30 impacted by language impairments.  Comprehension of mild-moderate level information is decreased and pt requires repetition intermitently. Recommend further ST on acute and HH or outpatient    SLP Assessment  SLP Recommendation/Assessment: Patient needs continued Speech Lanaguage Pathology Services SLP Visit Diagnosis: Aphasia (R47.01);Cognitive communication deficit (R41.841)    Follow Up Recommendations   (TBD- home health vs outpatient)    Frequency and Duration min 2x/week  2 weeks      SLP Evaluation Cognition  Overall Cognitive Status: Impaired/Different from baseline Arousal/Alertness: Awake/alert Orientation Level: Oriented to  person;Oriented to place;Oriented to situation (slightly off on day/date) Attention: Sustained Sustained Attention: Appears intact Memory: Impaired Memory Impairment: Retrieval deficit Awareness: Impaired Awareness Impairment: Emergent impairment (for lang errors, good anticipatory awareness) Problem Solving: Impaired Problem Solving Impairment: Verbal basic (suspect due to language interference) Safety/Judgment: Appears intact (?)       Comprehension  Auditory Comprehension Overall Auditory Comprehension: Impaired Commands: Impaired Multistep Basic Commands:  (suspect deficits) Conversation: Simple Visual Recognition/Discrimination Discrimination: Not tested Reading Comprehension Reading Status:  (TBA)    Expression Expression Primary Mode of Expression: Verbal Verbal Expression Overall Verbal Expression: Impaired Initiation: No impairment Level of Generative/Spontaneous Verbalization: Conversation Naming: No impairment Pragmatics: No impairment Other Verbal Expression Comments:  (phpnemic paraphasias) Written Expression Dominant Hand: Right Written Expression:  (TBA)   Oral / Motor  Oral Motor/Sensory Function Overall Oral Motor/Sensory Function: Within functional limits Motor Speech Overall Motor Speech: Appears within functional limits for tasks assessed Intelligibility: Intelligible Motor Planning: Witnin functional limits   GO                    Caleb Donaldson 04/05/2021, 11:15 AM

## 2021-04-05 NOTE — Progress Notes (Signed)
Spoke with Dr. Iver Nestle regarding pt soft BP. Pt assessment doing well with NIH between 1 and 2. No pressors to be ordered at this time. Will continue to monitor.

## 2021-04-05 NOTE — Evaluation (Signed)
Physical Therapy Evaluation Patient Details Name: Caleb Donaldson MRN: [id number] DOB: 05-25-1966 Today's Date: 04/05/2021   History of Present Illness  Pt is a 55 year old s/p left MCA M1 thrombectomy, embolic stroke. No mass effect. PMHx: active smoker.  Clinical Impression  Prior to admission, pt lives with his sister and works in [employer]. Pt presents with expressive aphasia and executive functioning deficits. Ambulating x 250 feet with no assistive device at a supervision-min guard assist level. Demonstrates decreased gait speed, but no gross instability or focal strength deficits. Will continue to follow acutely to promote mobility, but do not anticipate need for PT follow up.    Follow Up Recommendations No PT follow up;Supervision/Assistance - 24 hour (would be beneficial initially)    Equipment Recommendations  None recommended by PT    Recommendations for Other Services       Precautions / Restrictions Precautions Precautions: Fall Restrictions Weight Bearing Restrictions: No      Mobility  Bed Mobility Overal bed mobility: Modified Independent                  Transfers Overall transfer level: Needs assistance Equipment used: None Transfers: Sit to/from Stand Sit to Stand: Supervision            Ambulation/Gait Ambulation/Gait assistance: Supervision;Min guard Gait Distance (Feet): 250 Feet Assistive device: None Gait Pattern/deviations: Step-through pattern;Decreased stride length Gait velocity: decreased   General Gait Details: Slowed pace, able to perfom balance challenge without instability, supervision-min guard for safety  Stairs            Wheelchair Mobility    Modified Rankin (Stroke Patients Only) Modified Rankin (Stroke Patients Only) Pre-Morbid Rankin Score: No symptoms Modified Rankin: Moderately severe disability     Balance Overall balance assessment: Mild deficits observed, not formally tested                                            Pertinent Vitals/Pain Pain Assessment: Faces Faces Pain Scale: Hurts little more Pain Location: headache Pain Descriptors / Indicators: Discomfort;Headache Pain Intervention(s): Monitored during session    Home Living Family/patient expects to be discharged to:: Private residence Living Arrangements: Other relatives (sister) Available Help at Discharge: Family;Available 24 hours/day Type of Home: House Home Access: Ramped entrance     Home Layout: One level Home Equipment: None      Prior Function Level of Independence: Independent         Comments: Works in Administrator, arts   Dominant Hand: Right    Extremity/Trunk Assessment   Upper Extremity Assessment Upper Extremity Assessment: Overall WFL for tasks assessed    Lower Extremity Assessment Lower Extremity Assessment: Overall WFL for tasks assessed    Cervical / Trunk Assessment Cervical / Trunk Assessment: Normal  Communication   Communication: No difficulties  Cognition Arousal/Alertness: Awake/alert Behavior During Therapy: WFL for tasks assessed/performed Overall Cognitive Status: Impaired/Different from baseline Area of Impairment: Memory;Awareness;Following commands;Problem solving                     Memory: Decreased short-term memory Following Commands: Follows multi-step commands inconsistently   Awareness: Anticipatory Problem Solving: Requires verbal cues General Comments: Pt with expressive aphasia, follows one step commands consistently, follow multi step commands inconsistently. Requires min cues for path finding. Difficulty identifying button on call bell to notify  nurse      General Comments      Exercises     Assessment/Plan    PT Assessment Patient needs continued PT services  PT Problem List Decreased balance;Decreased mobility;Decreased cognition;Decreased safety awareness       PT  Treatment Interventions Gait training;Stair training;Functional mobility training;Therapeutic activities;Therapeutic exercise;Balance training;Patient/family education    PT Goals (Current goals can be found in the Care Plan section)  Acute Rehab PT Goals Patient Stated Goal: return home and to work when able PT Goal Formulation: With patient Time For Goal Achievement: 04/19/21 Potential to Achieve Goals: Good    Frequency Min 4X/week   Barriers to discharge        Co-evaluation               AM-PAC PT "6 Clicks" Mobility  Outcome Measure Help needed turning from your back to your side while in a flat bed without using bedrails?: None Help needed moving from lying on your back to sitting on the side of a flat bed without using bedrails?: None Help needed moving to and from a bed to a chair (including a wheelchair)?: A Little Help needed standing up from a chair using your arms (e.g., wheelchair or bedside chair)?: A Little Help needed to walk in hospital room?: A Little Help needed climbing 3-5 steps with a railing? : A Little 6 Click Score: 20    End of Session Equipment Utilized During Treatment: Gait belt Activity Tolerance: Patient tolerated treatment well Patient left: in chair;with call bell/phone within reach;with chair alarm set Nurse Communication: Mobility status PT Visit Diagnosis: Unsteadiness on feet (R26.81)    Time: 2248-2500 PT Time Calculation (min) (ACUTE ONLY): 17 min   Charges:   PT Evaluation $PT Eval Low Complexity: 1 Low          Lillia Pauls, PT, DPT Acute Rehabilitation Services Pager [phone] Office [phone]   Norval Morton 04/05/2021, 2:38 PM

## 2021-04-05 NOTE — Progress Notes (Signed)
Pt's BP's varying widely: having bouts of coughing and hypertension in 170s requiring sedation, then subsequent hypotension. Discussed with medical team, requested extubation. Plan to assess for extubation ASAP post 4 pm imaging.

## 2021-04-05 NOTE — Progress Notes (Signed)
NAME:  Caleb Donaldson, MRN:  [id number], DOB:  1966-07-03, LOS: 1 ADMISSION DATE:  04/04/2021, CONSULTATION DATE: 5/16 REFERRING MD:  Arvilla Market MD, CHIEF COMPLAINT:  Aphasia  History of Present Illness:  Caleb Donaldson is a 55 y.o. with a reported past medical history of smoking, who presented to [place] on 5/16 and was found to have an acute M1 occlusion. At [facility] he underwent thrombectomy on 6/16 with revascularization of the L MCA (TICI 3), for which he remained intubated post procedure. He remains in 4N ICU in stable condition.  PCCM was consulted for ventilatory assistance   Pertinent  Medical History  Active Smoking  Significant Hospital Events: Including procedures, antibiotic start and stop dates in addition to other pertinent events   . 5/15 LKW 7pm . 5/16 presented to [facility] 7am, transfer to [facility]. NIR thrombectomy of L MCA. Extubated in afternoon. CT head 6 hr post procedure shows SAH  Interim History / Subjective:  Extubated yesterday.   Tmax 99.2.  No drips  1300 UOP, +902 admit  Subjective: "Feels pretty good" endorses headache, denies pain otherwise.    Objective   Blood pressure 102/62, pulse 65, temperature 99.2 F (37.3 C), temperature source Oral, resp. rate (!) 22, height 5\' 4"  (1.626 m), weight 60.8 kg, SpO2 95 %. on room air      Intake/Output Summary (Last 24 hours) at 04/05/2021 0713 Last data filed at 04/05/2021 0400 Gross per 24 hour  Intake 2202.83 ml  Output 1300 ml  Net 902.83 ml   Filed Weights   04/04/21 0725  Weight: 60.8 kg    Examination: General:  Asleep, no acute distress, appears comfortable HEENT: MM pink/moist, icteric/anicteric, trachea midline  Neuro: GCS 14, eyes open to voice A&O x4, RASS 0, PERRL90mm CV: S1S2, NSR, no m/r/g appreciated PULM:  Clear in the upper lobes and in the lower lobes, scant secretions, chest expansion symmetric GI: soft, bsx4 active, nontender Extremities: warm/dry, no pretibial edema, capillary  refill greater/less than 3 seconds  Skin: small pustules on bl dorsal foot   Labs/imaging that I havepersonally reviewed  (right click and "Reselect all SmartList Selections" daily)  BMP- WNL CBC- WNL CT head- New SAH BLLE dopplers- WNL  Resolved Hospital Problem list     Assessment & Plan:  M1 Occlusion, S/P L MCA mechanical thrombectomy (TICI 3)  SAH- Seen 5/17 4PM CT head -Management per neurology/stroke. Stroke workoup per neurology -Goal Normotension. Not on antihypertensives at home.  -Started Keppra 500 BID for seven days for seizure ppx based on insular stroke on MRI. -Continue neuro checks. -SLP, PT, OT eval. Follow for recs.  Acute Respiratory Failure requiring Mechanical Ventilation- Resolved Active Smoking History Extubated 5/16 -Continue PRN duonebs -Encourage pulmonary hygiene. OOB, DB cough, IS.  -No further ventilatory needs. -Stressed need for smoking cessation. Continue smoking cessation education   Pustules on BL dorsalis pedis See images in chart, Patient states on exam that he "got into fire ants a few days ago" -Continue to monitor   Best practice (right click and "Reselect all SmartList Selections" daily)  Diet:  NPO Pain/Anxiety/Delirium protocol (if indicated): Yes (RASS goal 0) VAP protocol (if indicated): Not indicated DVT prophylaxis: Contraindicated GI prophylaxis: PPI Glucose control:  SSI No Central venous access:  N/A Arterial line:  N/A Foley:  N/A Mobility:  OOB  PT consulted: Yes Last date of multidisciplinary goals of care discussion [Per primary] Code Status:  full code Disposition: ICU   Critical care time: N/A  Gershon Mussel., MSN, APRN, AGACNP-BC [facility]  04/05/2021 , 7:13 AM  Please see [email or web address] for pager details  If no response, please call [phone] After hours, please call Elink at [phone]

## 2021-04-05 NOTE — Plan of Care (Signed)
  Problem: Education: Goal: Knowledge of disease or condition will improve Outcome: Progressing Goal: Knowledge of secondary prevention will improve Outcome: Progressing Goal: Knowledge of patient specific risk factors addressed and post discharge goals established will improve Outcome: Progressing Goal: Individualized Educational Video(s) Outcome: Progressing   Problem: Coping: Goal: Will identify appropriate support needs Outcome: Progressing   Problem: Health Behavior/Discharge Planning: Goal: Ability to manage health-related needs will improve Outcome: Progressing   Problem: Self-Care: Goal: Ability to participate in self-care as condition permits will improve Outcome: Progressing Goal: Ability to communicate needs accurately will improve Outcome: Progressing   Problem: Nutrition: Goal: Risk of aspiration will decrease 04/05/2021 1829 by Wille Glaser, RN Outcome: Progressing 04/05/2021 1019 by Wille Glaser, RN Outcome: Progressing   Problem: Ischemic Stroke/TIA Tissue Perfusion: Goal: Complications of ischemic stroke/TIA will be minimized Outcome: Progressing

## 2021-04-06 ENCOUNTER — Inpatient Hospital Stay (HOSPITAL_COMMUNITY): Payer: Medicaid Other | Admitting: Anesthesiology

## 2021-04-06 ENCOUNTER — Encounter (HOSPITAL_COMMUNITY): Payer: Self-pay | Admitting: Neurology

## 2021-04-06 ENCOUNTER — Encounter (HOSPITAL_COMMUNITY)
Admission: EM | Disposition: A | Discharge status: Home / Self Care | Payer: Self-pay | Source: Other Acute Inpatient Hospital | Attending: Neurology

## 2021-04-06 ENCOUNTER — Inpatient Hospital Stay (HOSPITAL_COMMUNITY): Payer: Medicaid Other

## 2021-04-06 DIAGNOSIS — Q211 Atrial septal defect: Secondary | ICD-10-CM

## 2021-04-06 DIAGNOSIS — I639 Cerebral infarction, unspecified: Secondary | ICD-10-CM

## 2021-04-06 HISTORY — PX: TEE WITHOUT CARDIOVERSION: SHX5443

## 2021-04-06 HISTORY — PX: BUBBLE STUDY: SHX6837

## 2021-04-06 LAB — CBC
HCT: 40.2 % (ref 39.0–52.0)
Hemoglobin: 13.1 g/dL (ref 13.0–17.0)
MCH: 31 pg (ref 26.0–34.0)
MCHC: 32.6 g/dL (ref 30.0–36.0)
MCV: 95.3 fL (ref 80.0–100.0)
Platelets: 192 10*3/uL (ref 150–400)
RBC: 4.22 MIL/uL (ref 4.22–5.81)
RDW: 12.6 % (ref 11.5–15.5)
WBC: 7.9 10*3/uL (ref 4.0–10.5)
nRBC: 0 % (ref 0.0–0.2)

## 2021-04-06 LAB — BASIC METABOLIC PANEL
Anion gap: 4 — ABNORMAL LOW (ref 5–15)
BUN: 9 mg/dL (ref 6–20)
CO2: 25 mmol/L (ref 22–32)
Calcium: 8.2 mg/dL — ABNORMAL LOW (ref 8.9–10.3)
Chloride: 110 mmol/L (ref 98–111)
Creatinine, Ser: 0.86 mg/dL (ref 0.61–1.24)
GFR, Estimated: >60 mL/min (ref >60)
Glucose, Bld: 90 mg/dL (ref 70–99)
Potassium: 3.9 mmol/L (ref 3.5–5.1)
Sodium: 139 mmol/L (ref 135–145)

## 2021-04-06 SURGERY — ECHOCARDIOGRAM, TRANSESOPHAGEAL
Anesthesia: Monitor Anesthesia Care

## 2021-04-06 MED ORDER — PROPOFOL 500 MG/50ML IV EMUL
INTRAVENOUS | Status: DC | PRN
  Administered 2021-04-06: 150 ug/kg/min via INTRAVENOUS

## 2021-04-06 NOTE — Progress Notes (Signed)
  Echocardiogram Echocardiogram Transesophageal has been performed.  Gerda Diss 04/06/2021, 1:45 PM

## 2021-04-06 NOTE — Interval H&P Note (Signed)
History and Physical Interval Note:  04/06/2021 12:42 PM  Caleb Donaldson  has presented today for surgery, with the diagnosis of STROKE.  The various methods of treatment have been discussed with the patient and family. After consideration of risks, benefits and other options for treatment, the patient has consented to  Procedure(s): TRANSESOPHAGEAL ECHOCARDIOGRAM (TEE) (N/A) as a surgical intervention.  The patient's history has been reviewed, patient examined, no change in status, stable for surgery.  I have reviewed the patient's chart and labs.  Questions were answered to the patient's satisfaction.     Charlton Haws

## 2021-04-06 NOTE — CV Procedure (Signed)
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

## 2021-04-06 NOTE — Anesthesia Preprocedure Evaluation (Signed)
Anesthesia Evaluation  Patient identified by MRN, date of birth, ID band Patient awake    Reviewed: Allergy & Precautions, NPO status , Patient's Chart, lab work & pertinent test results  Airway Mallampati: II  TM Distance: >3 FB Neck ROM: Full    Dental  (+) Edentulous Upper, Edentulous Lower, Dental Advisory Given   Pulmonary Current Smoker and Patient abstained from smoking.,    breath sounds clear to auscultation (-) wheezing      Cardiovascular negative cardio ROS   Rhythm:Regular Rate:Normal     Neuro/Psych CVA    GI/Hepatic negative GI ROS, Neg liver ROS,   Endo/Other  negative endocrine ROS  Renal/GU negative Renal ROS     Musculoskeletal negative musculoskeletal ROS (+)   Abdominal   Peds  Hematology negative hematology ROS (+)   Anesthesia Other Findings   Reproductive/Obstetrics                             Anesthesia Physical  Anesthesia Plan  ASA: II  Anesthesia Plan: MAC   Post-op Pain Management:    Induction: Intravenous  PONV Risk Score and Plan: 2 and Ondansetron, Dexamethasone and Treatment may vary due to age or medical condition  Airway Management Planned:   Additional Equipment:   Intra-op Plan:   Post-operative Plan:   Informed Consent: I have reviewed the patients History and Physical, chart, labs and discussed the procedure including the risks, benefits and alternatives for the proposed anesthesia with the patient or authorized representative who has indicated his/her understanding and acceptance.     Dental advisory given  Plan Discussed with: CRNA  Anesthesia Plan Comments:         Anesthesia Quick Evaluation

## 2021-04-06 NOTE — Progress Notes (Signed)
Occupational Therapy Treatment Patient Details Name: Caleb Donaldson MRN: [id number] DOB: 1966/02/12 Today's Date: 04/06/2021    History of present illness Pt is a 55 year old s/p left MCA M1 thrombectomy, embolic stroke. No mass effect. PMHx: active smoker.   OT comments  Session's focus on cognition. Pt simulating ordering breakfast and required cues to choose 1 item at a time as pt would repeat choices instead of choosing one. Pt A/Ox4 today with no assist for exact date. Pt with little carry over from education from hospital staff regarding CVA info. Pt education for "BEFAST" and able to recall  1/6 symptoms after education. Pt conversing with sister, but talking about ex-wife as if they were still married. Pt requires cues to complete a task and is very HOH. Pt physically continues to be doing well, but continues to have difficulty processing. OT following acutely.   Follow Up Recommendations  Outpatient OT (for cognition- can be addressed by SLP if needed)    Equipment Recommendations  None recommended by OT    Recommendations for Other Services      Precautions / Restrictions Precautions Precautions: Fall Restrictions Weight Bearing Restrictions: No       Mobility Bed Mobility Overal bed mobility: Modified Independent             General bed mobility comments: Pt safely transitions supine <> sit EOB with mod I, but needs cues and assistance for managing lines.    Transfers Overall transfer level: Needs assistance Equipment used: None Transfers: Sit to/from Stand Sit to Stand: Supervision         General transfer comment: DNT    Balance Overall balance assessment: Mild deficits observed, not formally tested                               Standardized Balance Assessment Standardized Balance Assessment : Dynamic Gait Index   Dynamic Gait Index Level Surface: Normal Change in Gait Speed: Moderate Impairment Gait with Horizontal Head Turns:  Normal Gait with Vertical Head Turns: Normal Gait and Pivot Turn: Normal Step Over Obstacle: Mild Impairment Step Around Obstacles: Mild Impairment Steps: Moderate Impairment Total Score: 18     ADL either performed or assessed with clinical judgement   ADL Overall ADL's : At baseline                                       General ADL Comments: SupervisionA advised for ADL.     Vision   Vision Assessment?: No apparent visual deficits   Perception     Praxis      Cognition Arousal/Alertness: Awake/alert Behavior During Therapy: WFL for tasks assessed/performed Overall Cognitive Status: Impaired/Different from baseline Area of Impairment: Memory;Awareness;Following commands;Problem solving                     Memory: Decreased short-term memory Following Commands: Follows multi-step commands inconsistently   Awareness: Anticipatory Problem Solving: Requires verbal cues General Comments: Session's focus on cognition. Pt simulating ordering breakfast and required cues to choose 1 item at a time as pt would repeat choices instead of choosing one. Pt A/Ox4 today with no assist for exact date. Pt with little carry over from education from hospital staff- pt stating "How did I get like this and have a stroke? I am a healthy guy?" Pt education provided, but unaware  of increased caffeine, sugary/complex carb diet and stress at work all contribute. Pt conversing with sister, but talking about ex-wife as if they were still married. Pt requires cues to complete a task and is very HOH.        Exercises     Shoulder Instructions       General Comments pt's sister in room and aware of cognitive deficits,    Pertinent Vitals/ Pain       Pain Assessment: No/denies pain  Home Living                                          Prior Functioning/Environment              Frequency  Min 2X/week        Progress Toward Goals  OT  Goals(current goals can now be found in the care plan section)  Progress towards OT goals: Progressing toward goals  Acute Rehab OT Goals Patient Stated Goal: return home and to work when able OT Goal Formulation: With patient Time For Goal Achievement: 04/19/21 Potential to Achieve Goals: Good ADL Goals Additional ADL Goal #1: Pt will perform recall after 5 mins with 90% accuracy in 3/5 trials. Additional ADL Goal #2: Pt will perform higher level cognitive tasks x5 mins to continue to assess areas of impairment within ADL and mobility.  Plan Discharge plan remains appropriate    Co-evaluation                 AM-PAC OT "6 Clicks" Daily Activity     Outcome Measure   Help from another person eating meals?: None Help from another person taking care of personal grooming?: None Help from another person toileting, which includes using toliet, bedpan, or urinal?: A Little Help from another person bathing (including washing, rinsing, drying)?: A Little Help from another person to put on and taking off regular upper body clothing?: None Help from another person to put on and taking off regular lower body clothing?: A Little 6 Click Score: 21    End of Session    OT Visit Diagnosis: Cognitive communication deficit (R41.841) Symptoms and signs involving cognitive functions: Other cerebrovascular disease   Activity Tolerance Patient tolerated treatment well   Patient Left in bed;with call bell/phone within reach;with nursing/sitter in room   Nurse Communication Mobility status        Time: 1208-1225 OT Time Calculation (min): 17 min  Charges: OT General Charges $OT Visit: 1 Visit OT Treatments $Cognitive Funtion inital: Initial 15 mins  Flora Lipps, OTR/L Acute Rehabilitation Services Pager: [phone] Office: [phone]   Marquelle Musgrave C 04/06/2021, 5:46 PM

## 2021-04-06 NOTE — Progress Notes (Signed)
Physical Therapy Treatment Patient Details Name: Caleb Donaldson MRN: [id number] DOB: February 28, 1966 Today's Date: 04/06/2021    History of Present Illness Pt is a 55 year old s/p left MCA M1 thrombectomy, embolic stroke. No mass effect. PMHx: active smoker.    PT Comments    Pt continues to demonstrate fairly good balance with challenges during a DGI, appropriately changing speeds to challenges to prevent LOB, even though he scored 18 on the DGI. However, his one moment of trunk sway/minor LOB occurred initially after coming to stand and when pt became confused about a question, but he was able to recover with min guard-supervision. Pt also capable of navigating stairs with a handrail with min guard for safety. He appears to be primarily limited by his impaired cognition and therefore will need 24/7 assistance/supervision at home for these purposes. Will continue to follow acutely.   Follow Up Recommendations  No PT follow up;Supervision/Assistance - 24 hour     Equipment Recommendations  None recommended by PT    Recommendations for Other Services       Precautions / Restrictions Precautions Precautions: Fall Restrictions Weight Bearing Restrictions: No    Mobility  Bed Mobility Overal bed mobility: Modified Independent             General bed mobility comments: Pt safely transitions supine <> sit EOB with mod I, but needs cues and assistance for managing lines.    Transfers Overall transfer level: Needs assistance Equipment used: None Transfers: Sit to/from Stand Sit to Stand: Supervision         General transfer comment: No overt LOB, supervision for safety.  Ambulation/Gait Ambulation/Gait assistance: Supervision;Min guard Gait Distance (Feet): 500 Feet Assistive device: None Gait Pattern/deviations: Step-through pattern;Decreased stride length Gait velocity: decreased Gait velocity interpretation: 1.31 - 2.62 ft/sec, indicative of limited community  ambulator General Gait Details: Decreased pace with poor ability to change speeds. Pt with tendency to look inferiorly. Pt able to perform challenges of DGI without LOB but did display one minor trunk sway when initially coming to stand when pt became distracted and confused about a question, min guard-supervision for safety.   Stairs Stairs: Yes Stairs assistance: Min guard Stair Management: One rail Right;One rail Left;Step to pattern;Forwards Number of Stairs: 4 General stair comments: Ascends with R rail and descends with L without overt LOB, min guard for safety and cues for line management.   Wheelchair Mobility    Modified Rankin (Stroke Patients Only) Modified Rankin (Stroke Patients Only) Pre-Morbid Rankin Score: No symptoms Modified Rankin: Moderately severe disability     Balance Overall balance assessment: Mild deficits observed, not formally tested                               Standardized Balance Assessment Standardized Balance Assessment : Dynamic Gait Index   Dynamic Gait Index Level Surface: Normal Change in Gait Speed: Moderate Impairment Gait with Horizontal Head Turns: Normal Gait with Vertical Head Turns: Normal Gait and Pivot Turn: Normal Step Over Obstacle: Mild Impairment Step Around Obstacles: Mild Impairment Steps: Moderate Impairment Total Score: 18      Cognition Arousal/Alertness: Awake/alert Behavior During Therapy: WFL for tasks assessed/performed Overall Cognitive Status: Impaired/Different from baseline Area of Impairment: Memory;Awareness;Following commands;Problem solving                     Memory: Decreased short-term memory Following Commands: Follows multi-step commands inconsistently   Awareness: Anticipatory Problem Solving:  Requires verbal cues General Comments: Follows one step commands consistently, follow multi step commands inconsistently. Requires min cues for path finding. Pt asking for therapist  to repeat instructions often during session, indicating STM deficits.      Exercises      General Comments        Pertinent Vitals/Pain Pain Assessment: No/denies pain    Home Living                      Prior Function            PT Goals (current goals can now be found in the care plan section) Acute Rehab PT Goals Patient Stated Goal: return home and to work when able PT Goal Formulation: With patient Time For Goal Achievement: 04/19/21 Potential to Achieve Goals: Good Progress towards PT goals: Progressing toward goals    Frequency    Min 4X/week      PT Plan Current plan remains appropriate    Co-evaluation              AM-PAC PT "6 Clicks" Mobility   Outcome Measure  Help needed turning from your back to your side while in a flat bed without using bedrails?: None Help needed moving from lying on your back to sitting on the side of a flat bed without using bedrails?: None Help needed moving to and from a bed to a chair (including a wheelchair)?: A Little Help needed standing up from a chair using your arms (e.g., wheelchair or bedside chair)?: A Little Help needed to walk in hospital room?: A Little Help needed climbing 3-5 steps with a railing? : A Little 6 Click Score: 20    End of Session Equipment Utilized During Treatment: Gait belt Activity Tolerance: Patient tolerated treatment well Patient left: with call bell/phone within reach;in bed;with bed alarm set;with family/visitor present Nurse Communication: Mobility status PT Visit Diagnosis: Unsteadiness on feet (R26.81)     Time: 3710-6269 PT Time Calculation (min) (ACUTE ONLY): 34 min  Charges:  $Gait Training: 23-37 mins                     Caleb Donaldson, PT, DPT Acute Rehabilitation Services  Pager: [phone] Office: [phone]    Caleb Donaldson 04/06/2021, 2:45 PM

## 2021-04-06 NOTE — Transfer of Care (Signed)
Immediate Anesthesia Transfer of Care Note  Patient: Caleb Donaldson  Procedure(s) Performed: TRANSESOPHAGEAL ECHOCARDIOGRAM (TEE) (N/A ) BUBBLE STUDY  Patient Location: PACU and Endoscopy Unit  Anesthesia Type:MAC  Level of Consciousness: drowsy  Airway & Oxygen Therapy: Patient Spontanous Breathing and Patient connected to nasal cannula oxygen  Post-op Assessment: Report given to RN and Post -op Vital signs reviewed and stable  Post vital signs: Reviewed and stable  Last Vitals:  Vitals Value Taken Time  BP    Temp    Pulse    Resp    SpO2      Last Pain:  Vitals:   04/06/21 1240  TempSrc: Temporal  PainSc: 0-No pain         Complications: No complications documented.

## 2021-04-06 NOTE — Progress Notes (Signed)
Patient arrived to room [phone]

## 2021-04-06 NOTE — H&P (View-Only) (Signed)
STROKE TEAM PROGRESS NOTE   SUBJECTIVE (INTERVAL HISTORY) His RN and sister are at the bedside. Pt awake alert, conversing well. Pending TEE. Will repeat CT in am. PT/OT recommend outpt OT.   OBJECTIVE Temp:  [97.9 F (36.6 C)-100.2 F (37.9 C)] 99.8 F (37.7 C) (05/18 0800) Pulse Rate:  [56-87] 63 (05/18 0900) Cardiac Rhythm: Normal sinus rhythm (05/18 0800) Resp:  [16-28] 20 (05/18 0900) BP: (87-138)/(60-86) 128/76 (05/18 0800) SpO2:  [90 %-96 %] 94 % (05/18 0900)  Recent Labs  Lab 04/04/21 0727  GLUCAP 99   Recent Labs  Lab 04/04/21 1020 04/05/21 0604 04/06/21 0047  NA 141 142 139  K 4.0 4.0 3.9  CL  --  112* 110  CO2  --  26 25  GLUCOSE  --  104* 90  BUN  --  8 9  CREATININE  --  0.97 0.86  CALCIUM  --  8.2* 8.2*   No results for input(s): AST, ALT, ALKPHOS, BILITOT, PROT, ALBUMIN in the last 168 hours. Recent Labs  Lab 04/04/21 1020 04/05/21 0604 04/06/21 0047  WBC  --  10.4 7.9  NEUTROABS  --  8.0*  --   HGB 13.9 13.4 13.1  HCT 41.0 41.6 40.2  MCV  --  95.4 95.3  PLT  --  177 192   No results for input(s): CKTOTAL, CKMB, CKMBINDEX, TROPONINI in the last 168 hours. No results for input(s): LABPROT, INR in the last 72 hours. No results for input(s): COLORURINE, LABSPEC, PHURINE, GLUCOSEU, HGBUR, BILIRUBINUR, KETONESUR, PROTEINUR, UROBILINOGEN, NITRITE, LEUKOCYTESUR in the last 72 hours.  Invalid input(s): APPERANCEUR     Component Value Date/Time   CHOL 176 04/05/2021 0604   TRIG 121 04/05/2021 0604   HDL 32 (L) 04/05/2021 0604   CHOLHDL 5.5 04/05/2021 0604   VLDL 24 04/05/2021 0604   LDLCALC 120 (H) 04/05/2021 0604   Lab Results  Component Value Date   HGBA1C 5.7 (H) 04/05/2021      Component Value Date/Time   LABOPIA NONE DETECTED 04/04/2021 0754   COCAINSCRNUR NONE DETECTED 04/04/2021 0754   LABBENZ NONE DETECTED 04/04/2021 0754   AMPHETMU NONE DETECTED 04/04/2021 0754   THCU POSITIVE (A) 04/04/2021 0754   LABBARB NONE DETECTED  04/04/2021 0754    No results for input(s): ETH in the last 168 hours.  I have personally reviewed the radiological images below and agree with the radiology interpretations.  CT HEAD WO CONTRAST  Addendum Date: 04/04/2021   ADDENDUM REPORT: 04/04/2021 19:43 ADDENDUM: Findings discussed with Dr. Yu at 7:37 PM via telephone. Electronically Signed   By: Frederick S Jones MD   On: 04/04/2021 19:43   Result Date: 04/04/2021 CLINICAL DATA:  Stroke follow-up. EXAM: CT HEAD WITHOUT CONTRAST TECHNIQUE: Contiguous axial images were obtained from the base of the skull through the vertex without intravenous contrast. COMPARISON:  Limited CT scan performed postprocedural during thrombectomy on same day. Additional CT head code stroke and perfusion from the same day. FINDINGS: Brain: Status post left MCA thrombectomy. In comparison to immediate postprocedural CT scan, interval increase in bulging hyperdensity along the posterior aspect of left sylvian fissure that measures up to 2.6 cm. Overlying moderate volume subarachnoid hyperdensity along the left frontoparietal convexity. No specific evidence of acute large vascular territory infarct. No midline shift. Basal cisterns are patent. Vascular: Better evaluated on recent catheter arteriogram Skull: No acute fracture. Sinuses/Orbits: Please opacification of the right maxillary sinus. Mucosal thickening of scattered ethmoid air cells, left maxillary sinus and bilateral   sphenoid sinuses. Other: No mastoid effusions. IMPRESSION: 1. Status post left MCA thrombectomy. In comparison to immediate postprocedural CT scan, interval increase in bulging hyperdensity along the posterior aspect of left sylvian fissure that measures up to 2.6 cm, raising concern for acute subarachnoid hemorrhage (with possible intraparenchymal extension) over contrast staining. Overlying moderate volume subarachnoid hyperdensity along the left frontoparietal convexity may represent subarachnoid  hemorrhage and/or contrast staining. 2. No specific evidence of acute large vascular territory infarct. MRI could provide more sensitive evaluation in this patient with small infarct seen on recent perfusion. Electronically Signed: By: Frederick S Jones MD On: 04/04/2021 19:37   MR ANGIO HEAD WO CONTRAST  Result Date: 04/05/2021 CLINICAL DATA:  55-year-old male status post code stroke presentation yesterday with left M1 ELVO status post NIR with suspicion of acute hemorrhage on postprocedure CT. EXAM: MRI HEAD WITHOUT CONTRAST MRA HEAD WITHOUT CONTRAST TECHNIQUE: Multiplanar, multi-echo pulse sequences of the brain and surrounding structures were acquired without intravenous contrast. Angiographic images of the Circle of Willis were acquired using MRA technique without intravenous contrast. COMPARISON: No pertinent prior exam. COMPARISON:  Post left MCA thrombectomy CT 1556 hours yesterday, and earlier. FINDINGS: MRI HEAD FINDINGS Brain: Rounded area of intra-axial versus extra-axial mix signal intensity blood in the left sylvian fissure is oval measuring about 2.7 cm long axis, affecting the left insula. Mostly hyperintense T2 and decreased T1 signal intensity, susceptibility on SWI, and abnormal diffusion. Superimposed abnormal FLAIR hyperintensity within sulci of the left hemisphere, but no intraventricular blood is identified. On DWI there is abnormal diffusion and/or abnormal susceptibility associated with the left sylvian fissure lesion plus scattered small foci of cortical and subcortical white matter diffusion restriction from the level of the left operculum to the posterior left temporal lobe, junction with the inferior parietal lobe. Left basal ganglia largely spared. No contralateral right hemisphere or posterior fossa restricted diffusion. No ventriculomegaly or midline shift. Widespread nonspecific patchy bilateral cerebral white matter T2 and FLAIR hyperintensity. 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 No other no chronic cortical encephalomalacia identified. Occasional punctate chronic microhemorrhages are possible. Basilar cisterns remain normal. Cervicomedullary junction and pituitary are within normal limits. Vascular: Major intracranial vascular flow voids are preserved. Skull and upper cervical spine: Negative. Sinuses/Orbits: Negative orbits soft tissues. Paranasal sinus fluid and mucosal thickening bilaterally worst in the right maxillary sinus. Other: Mastoids are well aerated. Negative visible scalp and face soft tissues. MRA HEAD FINDINGS Anterior circulation: Antegrade flow in the posterior circulation with mildly dominant distal left vertebral artery. Normal PICA origins and vertebrobasilar junction. Patent basilar artery, AICA and SCA origins. Normal right PCA origin. Fetal type left PCA origin. Bilateral PCA branches are within normal limits. Antegrade flow in both ICA siphons. Mild irregularity of the supraclinoid segments but no significant siphon stenosis. Ophthalmic and left posterior communicating artery origin are within normal limits. Patent carotid termini, MCA and ACA origins. Anterior communicating artery with a median artery of the corpus callosum is within normal limits. Right MCA M1, bifurcation and visible right MCA branches are within normal limits. Visible ACA branches are within normal limits. Left MCA M1 is patent. The left MCA bifurcation is patent but irregular as seen on series 1053, image 3. There is severe stenosis of the inferior left MCA M2 division (series 1059, image 6), but no discrete left MCA branch occlusion is identified. IMPRESSION: 1. Oval 2.7 cm probable combined intra-axial and extra-axial hematoma centered at the Left insula/sylvian fissure(Heidelberg Classification 1C).   Small volume superimposed left hemisphere subarachnoid hemorrhage, with no IVH. 2. Abnormal diffusion in the region of #1, but otherwise only scattered  small cortical and subcortical infarcts are present in the lateral left hemisphere. 3. No significant intracranial mass effect.  No midline shift. 4. Patent Left MCA M1 with residual moderate to severe irregularity at the left MCA bi/trifurcation, most affecting the inferior M2 branch. Elsewhere negative intracranial MRA. Electronically Signed   By: H  Hall M.D.   On: 04/05/2021 06:46   MR BRAIN WO CONTRAST  Result Date: 04/05/2021 CLINICAL DATA:  55-year-old male status post code stroke presentation yesterday with left M1 ELVO status post NIR with suspicion of acute hemorrhage on postprocedure CT. EXAM: MRI HEAD WITHOUT CONTRAST MRA HEAD WITHOUT CONTRAST TECHNIQUE: Multiplanar, multi-echo pulse sequences of the brain and surrounding structures were acquired without intravenous contrast. Angiographic images of the Circle of Willis were acquired using MRA technique without intravenous contrast. COMPARISON: No pertinent prior exam. COMPARISON:  Post left MCA thrombectomy CT 1556 hours yesterday, and earlier. FINDINGS: MRI HEAD FINDINGS Brain: Rounded area of intra-axial versus extra-axial mix signal intensity blood in the left sylvian fissure is oval measuring about 2.7 cm long axis, affecting the left insula. Mostly hyperintense T2 and decreased T1 signal intensity, susceptibility on SWI, and abnormal diffusion. Superimposed abnormal FLAIR hyperintensity within sulci of the left hemisphere, but no intraventricular blood is identified. On DWI there is abnormal diffusion and/or abnormal susceptibility associated with the left sylvian fissure lesion plus scattered small foci of cortical and subcortical white matter diffusion restriction from the level of the left operculum to the posterior left temporal lobe, junction with the inferior parietal lobe. Left basal ganglia largely spared. No contralateral right hemisphere or posterior fossa restricted diffusion. No ventriculomegaly or midline shift. Widespread  nonspecific patchy bilateral cerebral white matter T2 and FLAIR hyperintensity. Small chronic infarct in the posterior left cerebellum near the torcula. No other no chronic cortical encephalomalacia identified. Occasional punctate chronic microhemorrhages are possible. Basilar cisterns remain normal. Cervicomedullary junction and pituitary are within normal limits. Vascular: Major intracranial vascular flow voids are preserved. Skull and upper cervical spine: Negative. Sinuses/Orbits: Negative orbits soft tissues. Paranasal sinus fluid and mucosal thickening bilaterally worst in the right maxillary sinus. Other: Mastoids are well aerated. Negative visible scalp and face soft tissues. MRA HEAD FINDINGS Anterior circulation: Antegrade flow in the posterior circulation with mildly dominant distal left vertebral artery. Normal PICA origins and vertebrobasilar junction. Patent basilar artery, AICA and SCA origins. Normal right PCA origin. Fetal type left PCA origin. Bilateral PCA branches are within normal limits. Antegrade flow in both ICA siphons. Mild irregularity of the supraclinoid segments but no significant siphon stenosis. Ophthalmic and left posterior communicating artery origin are within normal limits. Patent carotid termini, MCA and ACA origins. Anterior communicating artery with a median artery of the corpus callosum is within normal limits. Right MCA M1, bifurcation and visible right MCA branches are within normal limits. Visible ACA branches are within normal limits. Left MCA M1 is patent. The left MCA bifurcation is patent but irregular as seen on series 1053, image 3. There is severe stenosis of the inferior left MCA M2 division (series 1059, image 6), but no discrete left MCA branch occlusion is identified. IMPRESSION: 1. Oval 2.7 cm probable combined intra-axial and extra-axial hematoma centered at the Left insula/sylvian fissure(Heidelberg Classification 1C). Small volume superimposed left hemisphere  subarachnoid hemorrhage, with no IVH. 2. Abnormal diffusion in the region of #1, but otherwise only   scattered small cortical and subcortical infarcts are present in the lateral left hemisphere. 3. No significant intracranial mass effect.  No midline shift. 4. Patent Left MCA M1 with residual moderate to severe irregularity at the left MCA bi/trifurcation, most affecting the inferior M2 branch. Elsewhere negative intracranial MRA. Electronically Signed   By: H  Hall M.D.   On: 04/05/2021 06:46   CT CEREBRAL PERFUSION W CONTRAST  Result Date: 04/04/2021 CLINICAL DATA:  Stroke follow-up.  Aphasia EXAM: CT PERFUSION BRAIN TECHNIQUE: Multiphase CT imaging of the brain was performed following IV bolus contrast injection. Subsequent parametric perfusion maps were calculated using RAPID software. CONTRAST:  40mL OMNIPAQUE IOHEXOL 350 MG/ML SOLN COMPARISON:  CTA of the head neck from earlier today at [facility] FINDINGS: CT Brain Perfusion Findings: CBF (<30%) Volume: 14mL Perfusion (Tmax>6.0s) volume: 93mL Mismatch Volume: 79mL ASPECTS on noncontrast CT Head: 10 on head CT performed at the same time Infarct Core: 14 cc mL Infarction Location: Lateral left frontal lobe. IMPRESSION: Good correlation with prior CTA and aspects. 79 cc of left MCA penumbra with a background of 14 cc core infarct. Electronically Signed   By: Jonathon  Watts M.D.   On: 04/04/2021 07:49   Portable Chest x-ray  Result Date: 04/04/2021 CLINICAL DATA:  ET tube placement. EXAM: PORTABLE CHEST 1 VIEW COMPARISON:  12/23/2012 FINDINGS: 1144 hours. Endotracheal tube tip is 4.2 cm above the base of the carina. The NG tube passes into the stomach although the distal tip position is not included on the film. The lungs are clear without focal pneumonia, edema, pneumothorax or pleural effusion. The cardiopericardial silhouette is within normal limits for size. The visualized bony structures of the thorax show no acute abnormality. Telemetry leads  overlie the chest. IMPRESSION: No active disease. Electronically Signed   By: Eric  Mansell M.D.   On: 04/04/2021 12:18   ECHOCARDIOGRAM COMPLETE  Result Date: 04/04/2021    ECHOCARDIOGRAM REPORT   Patient Name:   Caleb Donaldson Date of Exam: 04/04/2021 Medical Rec #:  [id number]      Height: Accession #:    [id number]     Weight:       134.0 lb Date of Birth:  03/08/1966      BSA:          1.546 m Patient Age:    54 years       BP:           140/96 mmHg Patient Gender: M              HR:           77 bpm. Exam Location:  Inpatient Procedure: 2D Echo Indications:    Hx of CVA  History:        Patient has no prior history of Echocardiogram examinations.  Referring Phys: [id number] MCNEILL P KIRKPATRICK IMPRESSIONS  1. Left ventricular ejection fraction, by estimation, is 55 to 60%. The left ventricle has normal function. The left ventricle has no regional wall motion abnormalities. Left ventricular diastolic parameters were normal.  2. Right ventricular systolic function is normal. The right ventricular size is normal.  3. Right atrial size was mildly dilated.  4. The mitral valve is normal in structure. Trivial mitral valve regurgitation.  5. The aortic valve is normal in structure. Aortic valve regurgitation is not visualized.  6. The inferior vena cava is normal in size with greater than 50% respiratory variability, suggesting right atrial pressure of 3 mmHg. FINDINGS  Left Ventricle: Left   ventricular ejection fraction, by estimation, is 55 to 60%. The left ventricle has normal function. The left ventricle has no regional wall motion abnormalities. The left ventricular internal cavity size was normal in size. There is  no left ventricular hypertrophy. Left ventricular diastolic parameters were normal. Right Ventricle: The right ventricular size is normal. Right vetricular wall thickness was not assessed. Right ventricular systolic function is normal. Left Atrium: Left atrial size was normal in size. Right Atrium:  Right atrial size was mildly dilated. Pericardium: There is no evidence of pericardial effusion. Mitral Valve: The mitral valve is normal in structure. Trivial mitral valve regurgitation. Tricuspid Valve: The tricuspid valve is normal in structure. Tricuspid valve regurgitation is trivial. Aortic Valve: The aortic valve is normal in structure. Aortic valve regurgitation is not visualized. Pulmonic Valve: The pulmonic valve was not well visualized. Pulmonic valve regurgitation is not visualized. Aorta: The aortic root is normal in size and structure. Venous: The inferior vena cava is normal in size with greater than 50% respiratory variability, suggesting right atrial pressure of 3 mmHg. IAS/Shunts: No atrial level shunt detected by color flow Doppler.  LEFT VENTRICLE PLAX 2D LVIDd:         4.20 cm  Diastology LVIDs:         3.60 cm  LV e' medial:    9.68 cm/s LV PW:         1.00 cm  LV E/e' medial:  6.3 LV IVS:        0.70 cm  LV e' lateral:   12.50 cm/s LVOT diam:     1.90 cm  LV E/e' lateral: 4.9 LV SV:         51 LV SV Index:   33 LVOT Area:     2.84 cm  RIGHT VENTRICLE            IVC RV Basal diam:  3.10 cm    IVC diam: 1.50 cm RV Mid diam:    2.50 cm RV S prime:     9.46 cm/s TAPSE (M-mode): 1.6 cm LEFT ATRIUM             Index       RIGHT ATRIUM           Index LA diam:        2.60 cm 1.68 cm/m  RA Area:     11.80 cm LA Vol (A2C):   29.2 ml 18.88 ml/m RA Volume:   28.00 ml  18.11 ml/m LA Vol (A4C):   14.1 ml 9.12 ml/m LA Biplane Vol: 21.3 ml 13.77 ml/m  AORTIC VALVE LVOT Vmax:   74.40 cm/s LVOT Vmean:  52.100 cm/s LVOT VTI:    0.181 m  AORTA Ao Root diam: 3.10 cm Ao Asc diam:  2.70 cm MITRAL VALVE MV Area (PHT): 3.37 cm    SHUNTS MV Decel Time: 225 msec    Systemic VTI:  0.18 m MV E velocity: 61.30 cm/s  Systemic Diam: 1.90 cm MV A velocity: 52.30 cm/s MV E/A ratio:  1.17 Paula Ross MD Electronically signed by Paula Ross MD Signature Date/Time: 04/04/2021/11:43:32 AM    Final    CT HEAD CODE STROKE WO  CONTRAST  Result Date: 04/04/2021 CLINICAL DATA:  Code stroke.  Stroke follow-up EXAM: CT HEAD WITHOUT CONTRAST TECHNIQUE: Contiguous axial images were obtained from the base of the skull through the vertex without intravenous contrast. COMPARISON:  Earlier today FINDINGS: Brain: No evidence of acute infarction, hemorrhage, hydrocephalus, extra-axial collection   or mass lesion/mass effect. Small remote left cerebellar infarct. Vascular: High-density vessels diffusely in the setting of remote cyst in IV contrast Skull: Normal. Negative for fracture or focal lesion. Sinuses/Orbits: Sinus opacification on a chronic or recurrent basis, incidental to the history. Other: These results were called by telephone at the time of interpretation on 04/04/2021 at 7:41 am to provider MCNEILL KIRKPATRICK , who verbally acknowledged these results. ASPECTS ([place] Stroke Program Early CT Score) - Ganglionic level infarction (caudate, lentiform nuclei, internal capsule, insula, M1-M3 cortex): 7 - Supraganglionic infarction (M4-M6 cortex): 3 Total score (0-10 with 10 being normal): 10 IMPRESSION: Stable head CT.  ASPECTS remains 10. Electronically Signed   By: Jonathon  Watts M.D.   On: 04/04/2021 07:42   VAS US LOWER EXTREMITY VENOUS (DVT)  Result Date: 04/04/2021  Lower Venous DVT Study Patient Name:  Caleb Donaldson  Date of Exam:   04/04/2021 Medical Rec #: [id number]       Accession #:    [id number] Date of Birth: 06/30/1966       Patient Gender: M Patient Age:   54Y Exam Location:  [facility] Procedure:      VAS US LOWER EXTREMITY VENOUS (DVT) Referring Phys: [id number] Azavier Creson --------------------------------------------------------------------------------  Indications: Stroke.  Comparison Study: no prior Performing Technologist: Megan Riddle RVS  Examination Guidelines: A complete evaluation includes B-mode imaging, spectral Doppler, color Doppler, and power Doppler as needed of all accessible portions of each  vessel. Bilateral testing is considered an integral part of a complete examination. Limited examinations for reoccurring indications may be performed as noted. The reflux portion of the exam is performed with the patient in reverse Trendelenburg.  +---------+---------------+---------+-----------+----------+--------------+ RIGHT    CompressibilityPhasicitySpontaneityPropertiesThrombus Aging +---------+---------------+---------+-----------+----------+--------------+ CFV      Full           Yes      Yes                                 +---------+---------------+---------+-----------+----------+--------------+ SFJ      Full                                                        +---------+---------------+---------+-----------+----------+--------------+ FV Prox  Full                                                        +---------+---------------+---------+-----------+----------+--------------+ FV Mid   Full                                                        +---------+---------------+---------+-----------+----------+--------------+ FV DistalFull                                                        +---------+---------------+---------+-----------+----------+--------------+ PFV        Full                                                        +---------+---------------+---------+-----------+----------+--------------+ POP      Full           Yes      Yes                                 +---------+---------------+---------+-----------+----------+--------------+ PTV      Full                                                        +---------+---------------+---------+-----------+----------+--------------+ PERO     Full                                                        +---------+---------------+---------+-----------+----------+--------------+   +---------+---------------+---------+-----------+----------+--------------+ LEFT      CompressibilityPhasicitySpontaneityPropertiesThrombus Aging +---------+---------------+---------+-----------+----------+--------------+ CFV      Full           Yes      Yes                                 +---------+---------------+---------+-----------+----------+--------------+ SFJ      Full                                                        +---------+---------------+---------+-----------+----------+--------------+ FV Prox  Full                                                        +---------+---------------+---------+-----------+----------+--------------+ FV Mid   Full                                                        +---------+---------------+---------+-----------+----------+--------------+ FV DistalFull                                                        +---------+---------------+---------+-----------+----------+--------------+ PFV      Full                                                        +---------+---------------+---------+-----------+----------+--------------+   POP      Full           Yes      Yes                                 +---------+---------------+---------+-----------+----------+--------------+ PTV      Full                                                        +---------+---------------+---------+-----------+----------+--------------+ PERO     Full                                                        +---------+---------------+---------+-----------+----------+--------------+     Summary: BILATERAL: - No evidence of deep vein thrombosis seen in the lower extremities, bilaterally. -No evidence of popliteal cyst, bilaterally.   *See table(s) above for measurements and observations. Electronically signed by Christopher Clark MD on 04/04/2021 at 4:58:45 PM.    Final     PHYSICAL EXAM  Temp:  [97.9 F (36.6 C)-100.2 F (37.9 C)] 99.8 F (37.7 C) (05/18 0800) Pulse Rate:  [56-87] 63 (05/18 0900) Resp:   [16-28] 20 (05/18 0900) BP: (87-138)/(60-86) 128/76 (05/18 0800) SpO2:  [90 %-96 %] 94 % (05/18 0900)  General - Well nourished, well developed, in no apparent distress.  Ophthalmologic - fundi not visualized due to noncooperation.  Cardiovascular - Regular rhythm and rate.  Mental Status -  Level of arousal and orientation to time, place, and person were intact. Language including expression, naming, repetition, comprehension was assessed and found intact. Minimal intermittent paraphasic errors Fund of Knowledge was assessed and was intact.  Cranial Nerves II - XII - II - Visual field intact OU. III, IV, VI - Extraocular movements intact. V - Facial sensation intact bilaterally. VII - Facial movement intact bilaterally. VIII - Hearing & vestibular intact bilaterally. X - Palate elevates symmetrically. XI - Chin turning & shoulder shrug intact bilaterally. XII - Tongue protrusion intact.  Motor Strength - The patient's strength was normal in all extremities and pronator drift was absent.  Bulk was normal and fasciculations were absent.   Motor Tone - Muscle tone was assessed at the neck and appendages and was normal.  Reflexes - The patient's reflexes were symmetrical in all extremities and he had no pathological reflexes.  Sensory - Light touch, temperature/pinprick were assessed and were symmetrical.    Coordination - The patient had normal movements in the hands with no ataxia or dysmetria.  Tremor was absent.  Gait and Station - deferred.    ASSESSMENT/PLAN Mr. Caleb Donaldson is a 54 y.o. male with history of current smoker admitted for aphasia. No tPA given due to outside window.    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 Stable now . BP goal < 160 . Off Cleviprex  Long term BP goal normotensive  Hyperlipidemia  Home meds: None  LDL 120, goal < 70  Now on Lipitor 40  Continue statin at discharge  Tobacco abuse  Current heavy smoker  Smoking cessation counseling provided  Pt refused nicotine patch - wants "cold turkey"  Pt is willing to quit  Other Stroke Risk Factors  UDS positive for THC  Other Active Problems  On keppra before discharge per CCM  Hospital day # 2   Faaris Arizpe, MD PhD Stroke Neurology 04/06/2021 10:00 AM    To contact Stroke Continuity provider, please refer to [email or web address]. After hours, contact General Neurology 

## 2021-04-06 NOTE — Progress Notes (Signed)
STROKE TEAM PROGRESS NOTE   SUBJECTIVE (INTERVAL HISTORY) His RN and sister are at the bedside. Pt awake alert, conversing well. Pending TEE. Will repeat CT in am. PT/OT recommend outpt OT.   OBJECTIVE Temp:  [97.9 F (36.6 C)-100.2 F (37.9 C)] 99.8 F (37.7 C) (05/18 0800) Pulse Rate:  [56-87] 63 (05/18 0900) Cardiac Rhythm: Normal sinus rhythm (05/18 0800) Resp:  [16-28] 20 (05/18 0900) BP: (87-138)/(60-86) 128/76 (05/18 0800) SpO2:  [90 %-96 %] 94 % (05/18 0900)  Recent Labs  Lab 04/04/21 0727  GLUCAP 99   Recent Labs  Lab 04/04/21 1020 04/05/21 0604 04/06/21 0047  NA 141 142 139  K 4.0 4.0 3.9  CL  --  112* 110  CO2  --  26 25  GLUCOSE  --  104* 90  BUN  --  8 9  CREATININE  --  0.97 0.86  CALCIUM  --  8.2* 8.2*   No results for input(s): AST, ALT, ALKPHOS, BILITOT, PROT, ALBUMIN in the last 168 hours. Recent Labs  Lab 04/04/21 1020 04/05/21 0604 04/06/21 0047  WBC  --  10.4 7.9  NEUTROABS  --  8.0*  --   HGB 13.9 13.4 13.1  HCT 41.0 41.6 40.2  MCV  --  95.4 95.3  PLT  --  177 192   No results for input(s): CKTOTAL, CKMB, CKMBINDEX, TROPONINI in the last 168 hours. No results for input(s): LABPROT, INR in the last 72 hours. No results for input(s): COLORURINE, LABSPEC, PHURINE, GLUCOSEU, HGBUR, BILIRUBINUR, KETONESUR, PROTEINUR, UROBILINOGEN, NITRITE, LEUKOCYTESUR in the last 72 hours.  Invalid input(s): APPERANCEUR     Component Value Date/Time   CHOL 176 04/05/2021 0604   TRIG 121 04/05/2021 0604   HDL 32 (L) 04/05/2021 0604   CHOLHDL 5.5 04/05/2021 0604   VLDL 24 04/05/2021 0604   LDLCALC 120 (H) 04/05/2021 0604   Lab Results  Component Value Date   HGBA1C 5.7 (H) 04/05/2021      Component Value Date/Time   LABOPIA NONE DETECTED 04/04/2021 0754   COCAINSCRNUR NONE DETECTED 04/04/2021 0754   LABBENZ NONE DETECTED 04/04/2021 0754   AMPHETMU NONE DETECTED 04/04/2021 0754   THCU POSITIVE (A) 04/04/2021 0754   LABBARB NONE DETECTED  04/04/2021 0754    No results for input(s): ETH in the last 168 hours.  I have personally reviewed the radiological images below and agree with the radiology interpretations.  CT HEAD WO CONTRAST  Addendum Date: 04/04/2021   ADDENDUM REPORT: 04/04/2021 19:43 ADDENDUM: Findings discussed with Dr. Cathie Hoops at 7:37 PM via telephone. Electronically Signed   By: Feliberto Harts MD   On: 04/04/2021 19:43   Result Date: 04/04/2021 CLINICAL DATA:  Stroke follow-up. EXAM: CT HEAD WITHOUT CONTRAST TECHNIQUE: Contiguous axial images were obtained from the base of the skull through the vertex without intravenous contrast. COMPARISON:  Limited CT scan performed postprocedural during thrombectomy on same day. Additional CT head code stroke and perfusion from the same day. FINDINGS: Brain: Status post left MCA thrombectomy. In comparison to immediate postprocedural CT scan, interval increase in bulging hyperdensity along the posterior aspect of left sylvian fissure that measures up to 2.6 cm. Overlying moderate volume subarachnoid hyperdensity along the left frontoparietal convexity. No specific evidence of acute large vascular territory infarct. No midline shift. Basal cisterns are patent. Vascular: Better evaluated on recent catheter arteriogram Skull: No acute fracture. Sinuses/Orbits: Please opacification of the right maxillary sinus. Mucosal thickening of scattered ethmoid air cells, left maxillary sinus and bilateral  sphenoid sinuses. Other: No mastoid effusions. IMPRESSION: 1. Status post left MCA thrombectomy. In comparison to immediate postprocedural CT scan, interval increase in bulging hyperdensity along the posterior aspect of left sylvian fissure that measures up to 2.6 cm, raising concern for acute subarachnoid hemorrhage (with possible intraparenchymal extension) over contrast staining. Overlying moderate volume subarachnoid hyperdensity along the left frontoparietal convexity may represent subarachnoid  hemorrhage and/or contrast staining. 2. No specific evidence of acute large vascular territory infarct. MRI could provide more sensitive evaluation in this patient with small infarct seen on recent perfusion. Electronically Signed: By: Feliberto Harts MD On: 04/04/2021 19:37   MR ANGIO HEAD WO CONTRAST  Result Date: 04/05/2021 CLINICAL DATA:  55 year old male status post code stroke presentation yesterday with left M1 ELVO status post NIR with suspicion of acute hemorrhage on postprocedure CT. EXAM: MRI HEAD WITHOUT CONTRAST MRA HEAD WITHOUT CONTRAST TECHNIQUE: Multiplanar, multi-echo pulse sequences of the brain and surrounding structures were acquired without intravenous contrast. Angiographic images of the Circle of Willis were acquired using MRA technique without intravenous contrast. COMPARISON: No pertinent prior exam. COMPARISON:  Post left MCA thrombectomy CT 1556 hours yesterday, and earlier. FINDINGS: MRI HEAD FINDINGS Brain: Rounded area of intra-axial versus extra-axial mix signal intensity blood in the left sylvian fissure is oval measuring about 2.7 cm long axis, affecting the left insula. Mostly hyperintense T2 and decreased T1 signal intensity, susceptibility on SWI, and abnormal diffusion. Superimposed abnormal FLAIR hyperintensity within sulci of the left hemisphere, but no intraventricular blood is identified. On DWI there is abnormal diffusion and/or abnormal susceptibility associated with the left sylvian fissure lesion plus scattered small foci of cortical and subcortical white matter diffusion restriction from the level of the left operculum to the posterior left temporal lobe, junction with the inferior parietal lobe. Left basal ganglia largely spared. No contralateral right hemisphere or posterior fossa restricted diffusion. No ventriculomegaly or midline shift. Widespread nonspecific patchy bilateral cerebral white matter T2 and FLAIR hyperintensity. 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 No other no chronic cortical encephalomalacia identified. Occasional punctate chronic microhemorrhages are possible. Basilar cisterns remain normal. Cervicomedullary junction and pituitary are within normal limits. Vascular: Major intracranial vascular flow voids are preserved. Skull and upper cervical spine: Negative. Sinuses/Orbits: Negative orbits soft tissues. Paranasal sinus fluid and mucosal thickening bilaterally worst in the right maxillary sinus. Other: Mastoids are well aerated. Negative visible scalp and face soft tissues. MRA HEAD FINDINGS Anterior circulation: Antegrade flow in the posterior circulation with mildly dominant distal left vertebral artery. Normal PICA origins and vertebrobasilar junction. Patent basilar artery, AICA and SCA origins. Normal right PCA origin. Fetal type left PCA origin. Bilateral PCA branches are within normal limits. Antegrade flow in both ICA siphons. Mild irregularity of the supraclinoid segments but no significant siphon stenosis. Ophthalmic and left posterior communicating artery origin are within normal limits. Patent carotid termini, MCA and ACA origins. Anterior communicating artery with a median artery of the corpus callosum is within normal limits. Right MCA M1, bifurcation and visible right MCA branches are within normal limits. Visible ACA branches are within normal limits. Left MCA M1 is patent. The left MCA bifurcation is patent but irregular as seen on series 1053, image 3. There is severe stenosis of the inferior left MCA M2 division (series 1059, image 6), but no discrete left MCA branch occlusion is identified. IMPRESSION: 1. Oval 2.7 cm probable combined intra-axial and extra-axial hematoma centered at the Left insula/sylvian fissure(Heidelberg Classification 1C).  Small volume superimposed left hemisphere subarachnoid hemorrhage, with no IVH. 2. Abnormal diffusion in the region of #1, but otherwise only scattered  small cortical and subcortical infarcts are present in the lateral left hemisphere. 3. No significant intracranial mass effect.  No midline shift. 4. Patent Left MCA M1 with residual moderate to severe irregularity at the left MCA bi/trifurcation, most affecting the inferior M2 branch. Elsewhere negative intracranial MRA. Electronically Signed   By: Odessa Fleming M.D.   On: 04/05/2021 06:46   MR BRAIN WO CONTRAST  Result Date: 04/05/2021 CLINICAL DATA:  55 year old male status post code stroke presentation yesterday with left M1 ELVO status post NIR with suspicion of acute hemorrhage on postprocedure CT. EXAM: MRI HEAD WITHOUT CONTRAST MRA HEAD WITHOUT CONTRAST TECHNIQUE: Multiplanar, multi-echo pulse sequences of the brain and surrounding structures were acquired without intravenous contrast. Angiographic images of the Circle of Willis were acquired using MRA technique without intravenous contrast. COMPARISON: No pertinent prior exam. COMPARISON:  Post left MCA thrombectomy CT 1556 hours yesterday, and earlier. FINDINGS: MRI HEAD FINDINGS Brain: Rounded area of intra-axial versus extra-axial mix signal intensity blood in the left sylvian fissure is oval measuring about 2.7 cm long axis, affecting the left insula. Mostly hyperintense T2 and decreased T1 signal intensity, susceptibility on SWI, and abnormal diffusion. Superimposed abnormal FLAIR hyperintensity within sulci of the left hemisphere, but no intraventricular blood is identified. On DWI there is abnormal diffusion and/or abnormal susceptibility associated with the left sylvian fissure lesion plus scattered small foci of cortical and subcortical white matter diffusion restriction from the level of the left operculum to the posterior left temporal lobe, junction with the inferior parietal lobe. Left basal ganglia largely spared. No contralateral right hemisphere or posterior fossa restricted diffusion. No ventriculomegaly or midline shift. Widespread  nonspecific patchy bilateral cerebral white matter T2 and FLAIR hyperintensity. Small chronic infarct in the posterior left cerebellum near the torcula. No other no chronic cortical encephalomalacia identified. Occasional punctate chronic microhemorrhages are possible. Basilar cisterns remain normal. Cervicomedullary junction and pituitary are within normal limits. Vascular: Major intracranial vascular flow voids are preserved. Skull and upper cervical spine: Negative. Sinuses/Orbits: Negative orbits soft tissues. Paranasal sinus fluid and mucosal thickening bilaterally worst in the right maxillary sinus. Other: Mastoids are well aerated. Negative visible scalp and face soft tissues. MRA HEAD FINDINGS Anterior circulation: Antegrade flow in the posterior circulation with mildly dominant distal left vertebral artery. Normal PICA origins and vertebrobasilar junction. Patent basilar artery, AICA and SCA origins. Normal right PCA origin. Fetal type left PCA origin. Bilateral PCA branches are within normal limits. Antegrade flow in both ICA siphons. Mild irregularity of the supraclinoid segments but no significant siphon stenosis. Ophthalmic and left posterior communicating artery origin are within normal limits. Patent carotid termini, MCA and ACA origins. Anterior communicating artery with a median artery of the corpus callosum is within normal limits. Right MCA M1, bifurcation and visible right MCA branches are within normal limits. Visible ACA branches are within normal limits. Left MCA M1 is patent. The left MCA bifurcation is patent but irregular as seen on series 1053, image 3. There is severe stenosis of the inferior left MCA M2 division (series 1059, image 6), but no discrete left MCA branch occlusion is identified. IMPRESSION: 1. Oval 2.7 cm probable combined intra-axial and extra-axial hematoma centered at the Left insula/sylvian fissure(Heidelberg Classification 1C). Small volume superimposed left hemisphere  subarachnoid hemorrhage, with no IVH. 2. Abnormal diffusion in the region of #1, but otherwise only  scattered small cortical and subcortical infarcts are present in the lateral left hemisphere. 3. No significant intracranial mass effect.  No midline shift. 4. Patent Left MCA M1 with residual moderate to severe irregularity at the left MCA bi/trifurcation, most affecting the inferior M2 branch. Elsewhere negative intracranial MRA. Electronically Signed   By: Odessa FlemingH  Hall M.D.   On: 04/05/2021 06:46   CT CEREBRAL PERFUSION W CONTRAST  Result Date: 04/04/2021 CLINICAL DATA:  Stroke follow-up.  Aphasia EXAM: CT PERFUSION BRAIN TECHNIQUE: Multiphase CT imaging of the brain was performed following IV bolus contrast injection. Subsequent parametric perfusion maps were calculated using RAPID software. CONTRAST:  40mL OMNIPAQUE IOHEXOL 350 MG/ML SOLN COMPARISON:  [facility] head neck [facility] FINDINGS: CT Brain Perfusion Findings: CBF (<30%) Volume: 14mL Perfusion (Tmax>6.0s) volume: 93mL Mismatch Volume: 79mL ASPECTS on noncontrast CT Head: 10 on head CT performed at the same time Infarct Core: 14 cc mL Infarction Location: Lateral left frontal lobe. IMPRESSION: Good correlation with prior CTA and aspects. 79 cc of left MCA penumbra with a background of 14 cc core infarct. Electronically Signed   By: Marnee SpringJonathon  Watts M.D.   On: 04/04/2021 07:49   Portable Chest x-ray  Result Date: 04/04/2021 CLINICAL DATA:  ET tube placement. EXAM: PORTABLE CHEST 1 VIEW COMPARISON:  12/23/2012 FINDINGS: 1144 hours. Endotracheal tube tip is 4.2 cm above the base of the carina. The NG tube passes into the stomach although the distal tip position is not included on the film. The lungs are clear without focal pneumonia, edema, pneumothorax or pleural effusion. The cardiopericardial silhouette is within normal limits for size. The visualized bony structures of the thorax show no acute abnormality. Telemetry leads  overlie the chest. IMPRESSION: No active disease. Electronically Signed   By: Kennith CenterEric  Mansell M.D.   On: 04/04/2021 12:18   ECHOCARDIOGRAM COMPLETE  Result Date: 04/04/2021    ECHOCARDIOGRAM REPORT   Patient Name:   Caleb OchJERRY Donaldson Date of Exam: 04/04/2021 Medical Rec #:  [id number]      Height: Accession #:    [id number]     Weight:       134.0 lb Date of Birth:  04-Feb-1966      BSA:          1.546 m Patient Age:    54 years       BP:           140/96 mmHg Patient Gender: M              HR:           77 bpm. Exam Location:  Inpatient Procedure: 2D Echo Indications:    Hx of CVA  History:        Patient has no prior history of Echocardiogram examinations.  Referring Phys: [id number] MCNEILL P KIRKPATRICK IMPRESSIONS  1. Left ventricular ejection fraction, by estimation, is 55 to 60%. The left ventricle has normal function. The left ventricle has no regional wall motion abnormalities. Left ventricular diastolic parameters were normal.  2. Right ventricular systolic function is normal. The right ventricular size is normal.  3. Right atrial size was mildly dilated.  4. The mitral valve is normal in structure. Trivial mitral valve regurgitation.  5. The aortic valve is normal in structure. Aortic valve regurgitation is not visualized.  6. The inferior vena cava is normal in size with greater than 50% respiratory variability, suggesting right atrial pressure of 3 mmHg. FINDINGS  Left Ventricle: Left  ventricular ejection fraction, by estimation, is 55 to 60%. The left ventricle has normal function. The left ventricle has no regional wall motion abnormalities. The left ventricular internal cavity size was normal in size. There is  no left ventricular hypertrophy. Left ventricular diastolic parameters were normal. Right Ventricle: The right ventricular size is normal. Right vetricular wall thickness was not assessed. Right ventricular systolic function is normal. Left Atrium: Left atrial size was normal in size. Right Atrium:  Right atrial size was mildly dilated. Pericardium: There is no evidence of pericardial effusion. Mitral Valve: The mitral valve is normal in structure. Trivial mitral valve regurgitation. Tricuspid Valve: The tricuspid valve is normal in structure. Tricuspid valve regurgitation is trivial. Aortic Valve: The aortic valve is normal in structure. Aortic valve regurgitation is not visualized. Pulmonic Valve: The pulmonic valve was not well visualized. Pulmonic valve regurgitation is not visualized. Aorta: The aortic root is normal in size and structure. Venous: The inferior vena cava is normal in size with greater than 50% respiratory variability, suggesting right atrial pressure of 3 mmHg. IAS/Shunts: No atrial level shunt detected by color flow Doppler.  LEFT VENTRICLE PLAX 2D LVIDd:         4.20 cm  Diastology LVIDs:         3.60 cm  LV e' medial:    9.68 cm/s LV PW:         1.00 cm  LV E/e' medial:  6.3 LV IVS:        0.70 cm  LV e' lateral:   12.50 cm/s LVOT diam:     1.90 cm  LV E/e' lateral: 4.9 LV SV:         51 LV SV Index:   33 LVOT Area:     2.84 cm  RIGHT VENTRICLE            IVC RV Basal diam:  3.10 cm    IVC diam: 1.50 cm RV Mid diam:    2.50 cm RV S prime:     9.46 cm/s TAPSE (M-mode): 1.6 cm LEFT ATRIUM             Index       RIGHT ATRIUM           Index LA diam:        2.60 cm 1.68 cm/m  RA Area:     11.80 cm LA Vol (A2C):   29.2 ml 18.88 ml/m RA Volume:   28.00 ml  18.11 ml/m LA Vol (A4C):   14.1 ml 9.12 ml/m LA Biplane Vol: 21.3 ml 13.77 ml/m  AORTIC VALVE LVOT Vmax:   74.40 cm/s LVOT Vmean:  52.100 cm/s LVOT VTI:    0.181 m  AORTA Ao Root diam: 3.10 cm Ao Asc diam:  2.70 cm MITRAL VALVE MV Area (PHT): 3.37 cm    SHUNTS MV Decel Time: 225 msec    Systemic VTI:  0.18 m MV E velocity: 61.30 cm/s  Systemic Diam: 1.90 cm MV A velocity: 52.30 cm/s MV E/A ratio:  1.17 Dietrich Pates MD Electronically signed by Dietrich Pates MD Signature Date/Time: 04/04/2021/11:43:32 AM    Final    CT HEAD CODE STROKE WO  CONTRAST  Result Date: 04/04/2021 CLINICAL DATA:  Code stroke.  Stroke follow-up EXAM: CT HEAD WITHOUT CONTRAST TECHNIQUE: Contiguous axial images were obtained from the base of the skull through the vertex without intravenous contrast. COMPARISON:  Earlier today FINDINGS: Brain: No evidence of acute infarction, hemorrhage, hydrocephalus, extra-axial collection  or mass lesion/mass effect. Small remote left cerebellar infarct. Vascular: High-density vessels diffusely in the setting of remote cyst in IV contrast Skull: Normal. Negative for fracture or focal lesion. Sinuses/Orbits: Sinus opacification on a chronic or recurrent basis, incidental to the history. Other: These results were called by telephone at the time of interpretation on 04/04/2021 at 7:41 am to provider [facility] , who verbally acknowledged these results. ASPECTS [facility] Stroke Program Early CT Score) - Ganglionic level infarction (caudate, lentiform nuclei, internal capsule, insula, M1-M3 cortex): 7 - Supraganglionic infarction (M4-M6 cortex): 3 Total score (0-10 with 10 being normal): 10 IMPRESSION: Stable head CT.  ASPECTS remains 10. Electronically Signed   By: Marnee Spring M.D.   On: 04/04/2021 07:42   VAS [place] LOWER EXTREMITY VENOUS (DVT)  Result Date: 04/04/2021  Lower Venous DVT Study Patient Name:  Caleb Donaldson  Date of Exam:   04/04/2021 Medical Rec #: [id number]       Accession #:    [id number] Date of Birth: [birth year]       Patient Gender: M Patient Age:   54Y Exam Location:  [facility] Procedure:      VAS [place] LOWER EXTREMITY VENOUS (DVT) Referring Phys: [id number] Marvel Plan --------------------------------------------------------------------------------  Indications: Stroke.  Comparison Study: no prior Performing Technologist: Blanch Media RVS  Examination Guidelines: A complete evaluation includes B-mode imaging, spectral Doppler, color Doppler, and power Doppler as needed of all accessible portions of each  vessel. Bilateral testing is considered an integral part of a complete examination. Limited examinations for reoccurring indications may be performed as noted. The reflux portion of the exam is performed with the patient in reverse Trendelenburg.  +---------+---------------+---------+-----------+----------+--------------+ RIGHT    CompressibilityPhasicitySpontaneityPropertiesThrombus Aging +---------+---------------+---------+-----------+----------+--------------+ CFV      Full           Yes      Yes                                 +---------+---------------+---------+-----------+----------+--------------+ SFJ      Full                                                        +---------+---------------+---------+-----------+----------+--------------+ FV Prox  Full                                                        +---------+---------------+---------+-----------+----------+--------------+ FV Mid   Full                                                        +---------+---------------+---------+-----------+----------+--------------+ FV DistalFull                                                        +---------+---------------+---------+-----------+----------+--------------+ PFV  Full                                                        +---------+---------------+---------+-----------+----------+--------------+ POP      Full           Yes      Yes                                 +---------+---------------+---------+-----------+----------+--------------+ PTV      Full                                                        +---------+---------------+---------+-----------+----------+--------------+ PERO     Full                                                        +---------+---------------+---------+-----------+----------+--------------+   +---------+---------------+---------+-----------+----------+--------------+ LEFT      CompressibilityPhasicitySpontaneityPropertiesThrombus Aging +---------+---------------+---------+-----------+----------+--------------+ CFV      Full           Yes      Yes                                 +---------+---------------+---------+-----------+----------+--------------+ SFJ      Full                                                        +---------+---------------+---------+-----------+----------+--------------+ FV Prox  Full                                                        +---------+---------------+---------+-----------+----------+--------------+ FV Mid   Full                                                        +---------+---------------+---------+-----------+----------+--------------+ FV DistalFull                                                        +---------+---------------+---------+-----------+----------+--------------+ PFV      Full                                                        +---------+---------------+---------+-----------+----------+--------------+  POP      Full           Yes      Yes                                 +---------+---------------+---------+-----------+----------+--------------+ PTV      Full                                                        +---------+---------------+---------+-----------+----------+--------------+ PERO     Full                                                        +---------+---------------+---------+-----------+----------+--------------+     Summary: BILATERAL: - No evidence of deep vein thrombosis seen in the lower extremities, bilaterally. -No evidence of popliteal cyst, bilaterally.   *See table(s) above for measurements and observations. Electronically signed by Sherald Hess MD on 04/04/2021 at 4:58:45 PM.    Final     PHYSICAL EXAM  Temp:  [97.9 F (36.6 C)-100.2 F (37.9 C)] 99.8 F (37.7 C) (05/18 0800) Pulse Rate:  [56-87] 63 (05/18 0900) Resp:   [16-28] 20 (05/18 0900) BP: (87-138)/(60-86) 128/76 (05/18 0800) SpO2:  [90 %-96 %] 94 % (05/18 0900)  General - Well nourished, well developed, in no apparent distress.  Ophthalmologic - fundi not visualized due to noncooperation.  Cardiovascular - Regular rhythm and rate.  Mental Status -  Level of arousal and orientation to time, place, and person were intact. Language including expression, naming, repetition, comprehension was assessed and found intact. Minimal intermittent paraphasic errors Fund of Knowledge was assessed and was intact.  Cranial Nerves II - XII - II - Visual field intact OU. III, IV, VI - Extraocular movements intact. V - Facial sensation intact bilaterally. VII - Facial movement intact bilaterally. VIII - Hearing & vestibular intact bilaterally. X - Palate elevates symmetrically. XI - Chin turning & shoulder shrug intact bilaterally. XII - Tongue protrusion intact.  Motor Strength - The patient's strength was normal in all extremities and pronator drift was absent.  Bulk was normal and fasciculations were absent.   Motor Tone - Muscle tone was assessed at the neck and appendages and was normal.  Reflexes - The patient's reflexes were symmetrical in all extremities and he had no pathological reflexes.  Sensory - Light touch, temperature/pinprick were assessed and were symmetrical.    Coordination - The patient had normal movements in the hands with no ataxia or dysmetria.  Tremor was absent.  Gait and Station - deferred.    ASSESSMENT/PLAN Mr. Caleb Donaldson is a 55 y.o. male with history of current smoker admitted for aphasia. No tPA given due to outside window.    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 Stable now . BP goal < 160 . Off Cleviprex  Long term BP goal normotensive  Hyperlipidemia  Home meds: None  LDL 120, goal < 70  Now on Lipitor 40  Continue statin at discharge  Tobacco abuse  Current heavy smoker  Smoking cessation counseling provided  Pt refused nicotine patch - wants "cold [place]"  Pt is willing to quit  Other Stroke Risk Factors  UDS positive for THC  Other Active Problems  On keppra before discharge per [facility] day # 2   Marvel Plan, MD PhD Stroke Neurology 04/06/2021 10:00 AM    To contact Stroke Continuity provider, please refer to [email or web address]. After hours, contact General Neurology

## 2021-04-07 ENCOUNTER — Inpatient Hospital Stay (HOSPITAL_COMMUNITY): Payer: Medicaid Other

## 2021-04-07 ENCOUNTER — Other Ambulatory Visit: Payer: Self-pay | Admitting: Neurology

## 2021-04-07 DIAGNOSIS — Q211 Atrial septal defect: Secondary | ICD-10-CM

## 2021-04-07 DIAGNOSIS — E7800 Pure hypercholesterolemia, unspecified: Secondary | ICD-10-CM

## 2021-04-07 DIAGNOSIS — I639 Cerebral infarction, unspecified: Secondary | ICD-10-CM

## 2021-04-07 NOTE — Progress Notes (Signed)
Physical Therapy Treatment and discharge  Patient Details Name: Caleb Donaldson MRN: [id number] DOB: October 01, 1966 Today's Date: 04/07/2021    History of Present Illness Pt is a 55 year old s/p left MCA M1 thrombectomy, embolic stroke. No mass effect. PMHx: active smoker.    PT Comments    Pt making excellent progress. Improved DGI and berg balance score indicating low fall risk. Able to perform stairs and gait without assist. Also able to balance and weight shift on balance disc without UE support to simulate unlevel surfaces. Does not require further acute PT - goals met.   Cognition was mildly decreased but demonstrated good safety awareness and able to complete basic task - defer to OT for formal assessment.    Follow Up Recommendations  No PT follow up     Equipment Recommendations  None recommended by PT    Recommendations for Other Services       Precautions / Restrictions Precautions Precautions: None Restrictions Weight Bearing Restrictions: No    Mobility  Bed Mobility Overal bed mobility: Independent                  Transfers Overall transfer level: Independent   Transfers: Sit to/from Stand Sit to Stand: Independent            Ambulation/Gait Ambulation/Gait assistance: Independent Gait Distance (Feet): 500 Feet Assistive device: None   Gait velocity: normal   General Gait Details: Had supervision but demonstrated safely and with normal gait pattern   Stairs   Stairs assistance: Modified independent (Device/Increase time) Stair Management: No rails;Alternating pattern Number of Stairs: 8 General stair comments: Had supervision but performed safely and without difficulty   Wheelchair Mobility    Modified Rankin (Stroke Patients Only) Modified Rankin (Stroke Patients Only) Pre-Morbid Rankin Score: No symptoms Modified Rankin: Moderate disability     Balance Overall balance assessment: Independent   Sitting balance-Leahy Scale:  Normal       Standing balance-Leahy Scale: Normal               High level balance activites: Side stepping;Backward walking;Direction changes;Turns;Sudden stops;Head turns High Level Balance Comments: Also was able to tandem gait without LOB; had pt stand on balance disc (therapy stair rail available for support if need but performed without UE support) - performed anterior posterior and lateral weight shifts and mini squats - close supervision but pt performed without LOB Standardized Balance Assessment Standardized Balance Assessment : Dynamic Gait Index;Berg Balance Test Berg Balance Test Sit to Stand: Able to stand without using hands and stabilize independently Standing Unsupported: Able to stand safely 2 minutes Sitting with Back Unsupported but Feet Supported on Floor or Stool: Able to sit safely and securely 2 minutes Stand to Sit: Sits safely with minimal use of hands Transfers: Able to transfer safely, minor use of hands Standing Unsupported with Eyes Closed: Able to stand 10 seconds safely Standing Ubsupported with Feet Together: Able to place feet together independently and stand 1 minute safely From Standing, Reach Forward with Outstretched Arm: Can reach confidently >25 cm (10") From Standing Position, Pick up Object from Floor: Able to pick up shoe safely and easily From Standing Position, Turn to Look Behind Over each Shoulder: Looks behind from both sides and weight shifts well Turn 360 Degrees: Able to turn 360 degrees safely in 4 seconds or less Standing Unsupported, Alternately Place Feet on Step/Stool: Able to stand independently and complete 8 steps >20 seconds Standing Unsupported, One Foot in Front: Able to place foot  tandem independently and hold 30 seconds Standing on One Leg: Able to lift leg independently and hold > 10 seconds Total Score: 55 Dynamic Gait Index Level Surface: Normal Change in Gait Speed: Normal Gait with Horizontal Head Turns:  Normal Gait with Vertical Head Turns: Normal Gait and Pivot Turn: Normal Step Over Obstacle: Normal Step Around Obstacles: Normal Steps: Normal Total Score: 24      Cognition Arousal/Alertness: Awake/alert Behavior During Therapy: WFL for tasks assessed/performed Overall Cognitive Status: Within Functional Limits for tasks assessed                                 General Comments: Cognition WNL for basic task, discussing appointments, discussing diagnosis.  Did not formally test. Good safety awareness with tehrapy      Exercises      General Comments        Pertinent Vitals/Pain Pain Assessment: No/denies pain    Home Living Family/patient expects to be discharged to:: Private residence Living Arrangements: Other (Comment)                  Prior Function            PT Goals (current goals can now be found in the care plan section) Acute Rehab PT Goals PT Goal Formulation: All assessment and education complete, DC therapy Progress towards PT goals: Goals met/education completed, patient discharged from PT    Frequency           PT Plan Current plan remains appropriate    Co-evaluation              AM-PAC PT "6 Clicks" Mobility   Outcome Measure  Help needed turning from your back to your side while in a flat bed without using bedrails?: None Help needed moving from lying on your back to sitting on the side of a flat bed without using bedrails?: None Help needed moving to and from a bed to a chair (including a wheelchair)?: None Help needed standing up from a chair using your arms (e.g., wheelchair or bedside chair)?: None Help needed to walk in hospital room?: None Help needed climbing 3-5 steps with a railing? : None 6 Click Score: 24    End of Session   Activity Tolerance: Patient tolerated treatment well Patient left: with call bell/phone within reach;in chair (pt packing in room, demonstrates safe balance) Nurse  Communication: Mobility status PT Visit Diagnosis: Unsteadiness on feet (R26.81)     Time: 4081-4481 PT Time Calculation (min) (ACUTE ONLY): 16 min  Charges:  $Neuromuscular Re-education: 8-22 mins                     Abran Richard, PT Acute Rehab Services Pager [phone] [facility] [phone]     Caleb Donaldson 04/07/2021, 11:58 AM

## 2021-04-07 NOTE — TOC CAGE-AID Note (Signed)
Transition of Care [facility]) - CAGE-AID Screening   Patient Details  Name: Caleb Donaldson MRN: [id number] Date of Birth: 20-Dec-1965  [facility] CM/SW Contact:    Pollie Friar, RN Phone Number: 04/07/2021, 11:45 AM   Clinical Narrative: CM met with the patient and he acknowledges use of THC and says is going to quit after this event. He denies the need for outpatient/ inpatient counseling resources.   CAGE-AID Screening:    Have You Ever Felt You Ought to Cut Down on Your Drinking or Drug Use?: Yes Have People Annoyed You By Critizing Your Drinking Or Drug Use?: No Have You Felt Bad Or Guilty About Your Drinking Or Drug Use?: No Have You Ever Had a Drink or Used Drugs First Thing In The Morning to Steady Your Nerves or to Get Rid of a Hangover?: No CAGE-AID Score: 1  Substance Abuse Education Offered: Yes (patient refused counseling resources)

## 2021-04-07 NOTE — Plan of Care (Signed)

## 2021-04-07 NOTE — Discharge Summary (Signed)
Stroke Discharge Summary  Patient ID: Caleb Donaldson   MRN: [id number]      DOB: 06-05-1966  Date of Admission: 04/04/2021 Date of Discharge: 04/07/2021  Attending Physician:  Stroke, Md, MD, Stroke MD Consultant(s):    cardiology  Patient's PCP:  Default, Provider, MD  DISCHARGE DIAGNOSIS:  Active Problems:   Cerebral embolism with cerebral infarction s/p thrombectomy   Hemorrhagic conversion    Stroke (cerebrum) (HCC)   Middle cerebral artery embolism, left   PFO   HLD   Allergies as of 04/07/2021   No Known Allergies     Medication List    You have not been prescribed any medications.     LABORATORY STUDIES CBC    Component Value Date/Time   WBC 7.9 04/06/2021 0047   RBC 4.22 04/06/2021 0047   HGB 13.1 04/06/2021 0047   HCT 40.2 04/06/2021 0047   PLT 192 04/06/2021 0047   MCV 95.3 04/06/2021 0047   MCH 31.0 04/06/2021 0047   MCHC 32.6 04/06/2021 0047   RDW 12.6 04/06/2021 0047   LYMPHSABS 1.6 04/05/2021 0604   MONOABS 0.7 04/05/2021 0604   EOSABS 0.0 04/05/2021 0604   BASOSABS 0.0 04/05/2021 0604   CMP    Component Value Date/Time   NA 139 04/06/2021 0047   K 3.9 04/06/2021 0047   CL 110 04/06/2021 0047   CO2 25 04/06/2021 0047   GLUCOSE 90 04/06/2021 0047   BUN 9 04/06/2021 0047   CREATININE 0.86 04/06/2021 0047   CALCIUM 8.2 (L) 04/06/2021 0047   GFRNONAA >60 04/06/2021 0047   COAGSNo results found for: INR, PROTIME Lipid Panel    Component Value Date/Time   CHOL 176 04/05/2021 0604   TRIG 121 04/05/2021 0604   HDL 32 (L) 04/05/2021 0604   CHOLHDL 5.5 04/05/2021 0604   VLDL 24 04/05/2021 0604   LDLCALC 120 (H) 04/05/2021 0604   HgbA1C  Lab Results  Component Value Date   HGBA1C 5.7 (H) 04/05/2021   Urinalysis No results found for: COLORURINE, APPEARANCEUR, LABSPEC, PHURINE, GLUCOSEU, HGBUR, BILIRUBINUR, KETONESUR, PROTEINUR, UROBILINOGEN, NITRITE, LEUKOCYTESUR Urine Drug Screen     Component Value Date/Time   LABOPIA NONE  DETECTED 04/04/2021 0754   COCAINSCRNUR NONE DETECTED 04/04/2021 0754   LABBENZ NONE DETECTED 04/04/2021 0754   AMPHETMU NONE DETECTED 04/04/2021 0754   THCU POSITIVE (A) 04/04/2021 0754   LABBARB NONE DETECTED 04/04/2021 0754    Alcohol Level No results found for: ETH   SIGNIFICANT DIAGNOSTIC STUDIES CT HEAD WO CONTRAST  Result Date: 04/07/2021 CLINICAL DATA:  Intracranial hemorrhage, follow-up examination EXAM: CT HEAD WITHOUT CONTRAST TECHNIQUE: Contiguous axial images were obtained from the base of the skull through the vertex without intravenous contrast. COMPARISON:  04/04/2021 FINDINGS: Brain: Hematoma within the posterior aspect of the left sylvian fissure appears slightly decreased in size, measuring 1.6 x 2.7 x 2.0 cm in greatest dimension. There is increasing surrounding cytotoxic edema within the a anterior pole of the left temporal lobe and insular cortex. Small amount of subarachnoid hemorrhage is seen within the left sylvian fissure superiorly, improved since prior exam. No interval hemorrhage. Mild mass effect with effacement of the a sulci of the overlying temporal lobe is again identified and appears stable. There is no midline shift. Ventricular size is normal. Cerebellum is unremarkable. Vascular: No asymmetric hyperdense vasculature at the skull base. Skull: Intact Sinuses/Orbits: There is dense opacification of the right maxillary sinus as well as several ethmoid air cells bilaterally. Marked mucosal  thickening of the left maxillary sinus with small air-fluid level noted. Marked mucosal thickening within the left sphenoid sinus noted. These findings appear similar to prior examination. The orbits are unremarkable. Other: Mastoid air cells and middle ear cavities are clear. IMPRESSION: Decreasing bulbous hematoma within the posterior aspect of the left sylvian fissure with increasing surrounding rind of cytotoxic edema within the adjacent insular cortex and anterior pole of the  left temporal lobe. Stable minimal mass effect. Improving associated subarachnoid hemorrhage in this region. No interval hemorrhage. Electronically Signed   By: Helyn NumbersAshesh  Parikh MD   On: 04/07/2021 06:02   CT HEAD WO CONTRAST  Addendum Date: 04/04/2021   ADDENDUM REPORT: 04/04/2021 19:43 ADDENDUM: Findings discussed with Dr. Cathie HoopsYu at 7:37 PM via telephone. Electronically Signed   By: Feliberto HartsFrederick S Jones MD   On: 04/04/2021 19:43   Result Date: 04/04/2021 CLINICAL DATA:  Stroke follow-up. EXAM: CT HEAD WITHOUT CONTRAST TECHNIQUE: Contiguous axial images were obtained from the base of the skull through the vertex without intravenous contrast. COMPARISON:  Limited CT scan performed postprocedural during thrombectomy on same day. Additional CT head code stroke and perfusion from the same day. FINDINGS: Brain: Status post left MCA thrombectomy. In comparison to immediate postprocedural CT scan, interval increase in bulging hyperdensity along the posterior aspect of left sylvian fissure that measures up to 2.6 cm. Overlying moderate volume subarachnoid hyperdensity along the left frontoparietal convexity. No specific evidence of acute large vascular territory infarct. No midline shift. Basal cisterns are patent. Vascular: Better evaluated on recent catheter arteriogram Skull: No acute fracture. Sinuses/Orbits: Please opacification of the right maxillary sinus. Mucosal thickening of scattered ethmoid air cells, left maxillary sinus and bilateral sphenoid sinuses. Other: No mastoid effusions. IMPRESSION: 1. Status post left MCA thrombectomy. In comparison to immediate postprocedural CT scan, interval increase in bulging hyperdensity along the posterior aspect of left sylvian fissure that measures up to 2.6 cm, raising concern for acute subarachnoid hemorrhage (with possible intraparenchymal extension) over contrast staining. Overlying moderate volume subarachnoid hyperdensity along the left frontoparietal convexity may  represent subarachnoid hemorrhage and/or contrast staining. 2. No specific evidence of acute large vascular territory infarct. MRI could provide more sensitive evaluation in this patient with small infarct seen on recent perfusion. Electronically Signed: By: Feliberto HartsFrederick S Jones MD On: 04/04/2021 19:37   MR ANGIO HEAD WO CONTRAST  Result Date: 04/05/2021 CLINICAL DATA:  55 year old male status post code stroke presentation yesterday with left M1 ELVO status post NIR with suspicion of acute hemorrhage on postprocedure CT. EXAM: MRI HEAD WITHOUT CONTRAST MRA HEAD WITHOUT CONTRAST TECHNIQUE: Multiplanar, multi-echo pulse sequences of the brain and surrounding structures were acquired without intravenous contrast. Angiographic images of the Circle of Willis were acquired using MRA technique without intravenous contrast. COMPARISON: No pertinent prior exam. COMPARISON:  Post left MCA thrombectomy CT 1556 hours yesterday, and earlier. FINDINGS: MRI HEAD FINDINGS Brain: Rounded area of intra-axial versus extra-axial mix signal intensity blood in the left sylvian fissure is oval measuring about 2.7 cm long axis, affecting the left insula. Mostly hyperintense T2 and decreased T1 signal intensity, susceptibility on SWI, and abnormal diffusion. Superimposed abnormal FLAIR hyperintensity within sulci of the left hemisphere, but no intraventricular blood is identified. On DWI there is abnormal diffusion and/or abnormal susceptibility associated with the left sylvian fissure lesion plus scattered small foci of cortical and subcortical white matter diffusion restriction from the level of the left operculum to the posterior left temporal lobe, junction with the inferior parietal lobe. Left  basal ganglia largely spared. No contralateral right hemisphere or posterior fossa restricted diffusion. No ventriculomegaly or midline shift. Widespread nonspecific patchy bilateral cerebral white matter T2 and FLAIR hyperintensity. Small  chronic infarct in the posterior left cerebellum near the torcula. No other no chronic cortical encephalomalacia identified. Occasional punctate chronic microhemorrhages are possible. Basilar cisterns remain normal. Cervicomedullary junction and pituitary are within normal limits. Vascular: Major intracranial vascular flow voids are preserved. Skull and upper cervical spine: Negative. Sinuses/Orbits: Negative orbits soft tissues. Paranasal sinus fluid and mucosal thickening bilaterally worst in the right maxillary sinus. Other: Mastoids are well aerated. Negative visible scalp and face soft tissues. MRA HEAD FINDINGS Anterior circulation: Antegrade flow in the posterior circulation with mildly dominant distal left vertebral artery. Normal PICA origins and vertebrobasilar junction. Patent basilar artery, AICA and SCA origins. Normal right PCA origin. Fetal type left PCA origin. Bilateral PCA branches are within normal limits. Antegrade flow in both ICA siphons. Mild irregularity of the supraclinoid segments but no significant siphon stenosis. Ophthalmic and left posterior communicating artery origin are within normal limits. Patent carotid termini, MCA and ACA origins. Anterior communicating artery with a median artery of the corpus callosum is within normal limits. Right MCA M1, bifurcation and visible right MCA branches are within normal limits. Visible ACA branches are within normal limits. Left MCA M1 is patent. The left MCA bifurcation is patent but irregular as seen on series 1053, image 3. There is severe stenosis of the inferior left MCA M2 division (series 1059, image 6), but no discrete left MCA branch occlusion is identified. IMPRESSION: 1. Oval 2.7 cm probable combined intra-axial and extra-axial hematoma centered at the Left insula/sylvian fissure(Heidelberg Classification 1C). Small volume superimposed left hemisphere subarachnoid hemorrhage, with no IVH. 2. Abnormal diffusion in the region of #1, but  otherwise only scattered small cortical and subcortical infarcts are present in the lateral left hemisphere. 3. No significant intracranial mass effect.  No midline shift. 4. Patent Left MCA M1 with residual moderate to severe irregularity at the left MCA bi/trifurcation, most affecting the inferior M2 branch. Elsewhere negative intracranial MRA. Electronically Signed   By: Odessa Fleming M.D.   On: 04/05/2021 06:46   MR BRAIN WO CONTRAST  Result Date: 04/05/2021 CLINICAL DATA:  55 year old male status post code stroke presentation yesterday with left M1 ELVO status post NIR with suspicion of acute hemorrhage on postprocedure CT. EXAM: MRI HEAD WITHOUT CONTRAST MRA HEAD WITHOUT CONTRAST TECHNIQUE: Multiplanar, multi-echo pulse sequences of the brain and surrounding structures were acquired without intravenous contrast. Angiographic images of the Circle of Willis were acquired using MRA technique without intravenous contrast. COMPARISON: No pertinent prior exam. COMPARISON:  Post left MCA thrombectomy CT 1556 hours yesterday, and earlier. FINDINGS: MRI HEAD FINDINGS Brain: Rounded area of intra-axial versus extra-axial mix signal intensity blood in the left sylvian fissure is oval measuring about 2.7 cm long axis, affecting the left insula. Mostly hyperintense T2 and decreased T1 signal intensity, susceptibility on SWI, and abnormal diffusion. Superimposed abnormal FLAIR hyperintensity within sulci of the left hemisphere, but no intraventricular blood is identified. On DWI there is abnormal diffusion and/or abnormal susceptibility associated with the left sylvian fissure lesion plus scattered small foci of cortical and subcortical white matter diffusion restriction from the level of the left operculum to the posterior left temporal lobe, junction with the inferior parietal lobe. Left basal ganglia largely spared. No contralateral right hemisphere or posterior fossa restricted diffusion. No ventriculomegaly or midline  shift. Widespread nonspecific patchy  bilateral cerebral white matter T2 and FLAIR hyperintensity. Small chronic infarct in the posterior left cerebellum near the torcula. No other no chronic cortical encephalomalacia identified. Occasional punctate chronic microhemorrhages are possible. Basilar cisterns remain normal. Cervicomedullary junction and pituitary are within normal limits. Vascular: Major intracranial vascular flow voids are preserved. Skull and upper cervical spine: Negative. Sinuses/Orbits: Negative orbits soft tissues. Paranasal sinus fluid and mucosal thickening bilaterally worst in the right maxillary sinus. Other: Mastoids are well aerated. Negative visible scalp and face soft tissues. MRA HEAD FINDINGS Anterior circulation: Antegrade flow in the posterior circulation with mildly dominant distal left vertebral artery. Normal PICA origins and vertebrobasilar junction. Patent basilar artery, AICA and SCA origins. Normal right PCA origin. Fetal type left PCA origin. Bilateral PCA branches are within normal limits. Antegrade flow in both ICA siphons. Mild irregularity of the supraclinoid segments but no significant siphon stenosis. Ophthalmic and left posterior communicating artery origin are within normal limits. Patent carotid termini, MCA and ACA origins. Anterior communicating artery with a median artery of the corpus callosum is within normal limits. Right MCA M1, bifurcation and visible right MCA branches are within normal limits. Visible ACA branches are within normal limits. Left MCA M1 is patent. The left MCA bifurcation is patent but irregular as seen on series 1053, image 3. There is severe stenosis of the inferior left MCA M2 division (series 1059, image 6), but no discrete left MCA branch occlusion is identified. IMPRESSION: 1. Oval 2.7 cm probable combined intra-axial and extra-axial hematoma centered at the Left insula/sylvian fissure(Heidelberg Classification 1C). Small volume  superimposed left hemisphere subarachnoid hemorrhage, with no IVH. 2. Abnormal diffusion in the region of #1, but otherwise only scattered small cortical and subcortical infarcts are present in the lateral left hemisphere. 3. No significant intracranial mass effect.  No midline shift. 4. Patent Left MCA M1 with residual moderate to severe irregularity at the left MCA bi/trifurcation, most affecting the inferior M2 branch. Elsewhere negative intracranial MRA. Electronically Signed   By: Odessa Fleming M.D.   On: 04/05/2021 06:46   CT CEREBRAL PERFUSION W CONTRAST  Result Date: 04/04/2021 CLINICAL DATA:  Stroke follow-up.  Aphasia EXAM: CT PERFUSION BRAIN TECHNIQUE: Multiphase CT imaging of the brain was performed following IV bolus contrast injection. Subsequent parametric perfusion maps were calculated using RAPID software. CONTRAST:  40mL OMNIPAQUE IOHEXOL 350 MG/ML SOLN COMPARISON:  CTA of the head neck from earlier today at [facility] FINDINGS: CT Brain Perfusion Findings: CBF (<30%) Volume: 14mL Perfusion (Tmax>6.0s) volume: 93mL Mismatch Volume: 79mL ASPECTS on noncontrast CT Head: 10 on head CT performed at the same time Infarct Core: 14 cc mL Infarction Location: Lateral left frontal lobe. IMPRESSION: Good correlation with prior CTA and aspects. 79 cc of left MCA penumbra with a background of 14 cc core infarct. Electronically Signed   By: Marnee Spring M.D.   On: 04/04/2021 07:49   Portable Chest x-ray  Result Date: 04/04/2021 CLINICAL DATA:  ET tube placement. EXAM: PORTABLE CHEST 1 VIEW COMPARISON:  12/23/2012 FINDINGS: 1144 hours. Endotracheal tube tip is 4.2 cm above the base of the carina. The NG tube passes into the stomach although the distal tip position is not included on the film. The lungs are clear without focal pneumonia, edema, pneumothorax or pleural effusion. The cardiopericardial silhouette is within normal limits for size. The visualized bony structures of the thorax show no acute  abnormality. Telemetry leads overlie the chest. IMPRESSION: No active disease. Electronically Signed   By: Minerva Areola  Molli Posey M.D.   On: 04/04/2021 12:18   ECHOCARDIOGRAM COMPLETE  Result Date: 04/04/2021    ECHOCARDIOGRAM REPORT   Patient Name:   KENDARIOUS GUDINO Date of Exam: 04/04/2021 Medical Rec #:  [id number]      Height: Accession #:    [id number]     Weight:       134.0 lb Date of Birth:  1966/04/05      BSA:          1.546 m Patient Age:    54 years       BP:           140/96 mmHg Patient Gender: M              HR:           77 bpm. Exam Location:  Inpatient Procedure: 2D Echo Indications:    Hx of CVA  History:        Patient has no prior history of Echocardiogram examinations.  Referring Phys: [id number] MCNEILL P KIRKPATRICK IMPRESSIONS  1. Left ventricular ejection fraction, by estimation, is 55 to 60%. The left ventricle has normal function. The left ventricle has no regional wall motion abnormalities. Left ventricular diastolic parameters were normal.  2. Right ventricular systolic function is normal. The right ventricular size is normal.  3. Right atrial size was mildly dilated.  4. The mitral valve is normal in structure. Trivial mitral valve regurgitation.  5. The aortic valve is normal in structure. Aortic valve regurgitation is not visualized.  6. The inferior vena cava is normal in size with greater than 50% respiratory variability, suggesting right atrial pressure of 3 mmHg. FINDINGS  Left Ventricle: Left ventricular ejection fraction, by estimation, is 55 to 60%. The left ventricle has normal function. The left ventricle has no regional wall motion abnormalities. The left ventricular internal cavity size was normal in size. There is  no left ventricular hypertrophy. Left ventricular diastolic parameters were normal. Right Ventricle: The right ventricular size is normal. Right vetricular wall thickness was not assessed. Right ventricular systolic function is normal. Left Atrium: Left atrial size was  normal in size. Right Atrium: Right atrial size was mildly dilated. Pericardium: There is no evidence of pericardial effusion. Mitral Valve: The mitral valve is normal in structure. Trivial mitral valve regurgitation. Tricuspid Valve: The tricuspid valve is normal in structure. Tricuspid valve regurgitation is trivial. Aortic Valve: The aortic valve is normal in structure. Aortic valve regurgitation is not visualized. Pulmonic Valve: The pulmonic valve was not well visualized. Pulmonic valve regurgitation is not visualized. Aorta: The aortic root is normal in size and structure. Venous: The inferior vena cava is normal in size with greater than 50% respiratory variability, suggesting right atrial pressure of 3 mmHg. IAS/Shunts: No atrial level shunt detected by color flow Doppler.  LEFT VENTRICLE PLAX 2D LVIDd:         4.20 cm  Diastology LVIDs:         3.60 cm  LV e' medial:    9.68 cm/s LV PW:         1.00 cm  LV E/e' medial:  6.3 LV IVS:        0.70 cm  LV e' lateral:   12.50 cm/s LVOT diam:     1.90 cm  LV E/e' lateral: 4.9 LV SV:         51 LV SV Index:   33 LVOT Area:     2.84 cm  RIGHT VENTRICLE  IVC RV Basal diam:  3.10 cm    IVC diam: 1.50 cm RV Mid diam:    2.50 cm RV S prime:     9.46 cm/s TAPSE (M-mode): 1.6 cm LEFT ATRIUM             Index       RIGHT ATRIUM           Index LA diam:        2.60 cm 1.68 cm/m  RA Area:     11.80 cm LA Vol (A2C):   29.2 ml 18.88 ml/m RA Volume:   28.00 ml  18.11 ml/m LA Vol (A4C):   14.1 ml 9.12 ml/m LA Biplane Vol: 21.3 ml 13.77 ml/m  AORTIC VALVE LVOT Vmax:   74.40 cm/s LVOT Vmean:  52.100 cm/s LVOT VTI:    0.181 m  AORTA Ao Root diam: 3.10 cm Ao Asc diam:  2.70 cm MITRAL VALVE MV Area (PHT): 3.37 cm    SHUNTS MV Decel Time: 225 msec    Systemic VTI:  0.18 m MV E velocity: 61.30 cm/s  Systemic Diam: 1.90 cm MV A velocity: 52.30 cm/s MV E/A ratio:  1.17 Dietrich Pates MD Electronically signed by Dietrich Pates MD Signature Date/Time: 04/04/2021/11:43:32 AM     Final    ECHO TEE  Result Date: 04/06/2021    TRANSESOPHOGEAL ECHO REPORT   Patient Name:   JAYE POLIDORI Date of Exam: 04/06/2021 Medical Rec #:  [id number]      Height:       64.0 in Accession #:    [id number]     Weight:       134.0 lb Date of Birth:  [birth year]      BSA:          1.650 m Patient Age:    54 years       BP:           122/80 mmHg Patient Gender: M              HR:           68 bpm. Exam Location:  Inpatient Procedure: Transesophageal Echo Indications:    Stroke  History:        Patient has prior history of Echocardiogram examinations.                 Stroke.  Sonographer:    Ross Ludwig RDCS (AE) Referring Phys: [id number] Manson Passey PROCEDURE: The transesophogeal probe was passed without difficulty through the esophogus of the patient. Sedation performed by different physician. The patient developed no complications during the procedure. IMPRESSIONS  1. Mobile atrial septum with small PFO and positive bubble study.  2. Left ventricular ejection fraction, by estimation, is 60 to 65%. The left ventricle has normal function.  3. Right ventricular systolic function is normal. The right ventricular size is normal.  4. No left atrial/left atrial appendage thrombus was detected.  5. Right atrial size was mildly dilated.  6. The mitral valve is normal in structure. No evidence of mitral valve regurgitation.  7. The aortic valve is tricuspid. Aortic valve regurgitation is not visualized. No aortic stenosis is present.  8. Evidence of atrial level shunting detected by color flow Doppler. Agitated saline contrast bubble study was positive with shunting observed within 3-6 cardiac cycles suggestive of interatrial shunt. FINDINGS  Left Ventricle: Left ventricular ejection fraction, by estimation, is 60 to 65%. The left ventricle has normal function. The left ventricular internal cavity  size was normal in size. Right Ventricle: The right ventricular size is normal. No increase in right ventricular wall  thickness. Right ventricular systolic function is normal. Left Atrium: Left atrial size was normal in size. No left atrial/left atrial appendage thrombus was detected. Right Atrium: Right atrial size was mildly dilated. Pericardium: There is no evidence of pericardial effusion. Mitral Valve: The mitral valve is normal in structure. No evidence of mitral valve regurgitation. Tricuspid Valve: The tricuspid valve is normal in structure. Tricuspid valve regurgitation is trivial. Aortic Valve: The aortic valve is tricuspid. Aortic valve regurgitation is not visualized. No aortic stenosis is present. Pulmonic Valve: The pulmonic valve was normal in structure. Pulmonic valve regurgitation is trivial. Aorta: The aortic root is normal in size and structure. IAS/Shunts: Evidence of atrial level shunting detected by color flow Doppler. Agitated saline contrast bubble study was positive with shunting observed within 3-6 cardiac cycles suggestive of interatrial shunt. Additional Comments: Mobile atrial septum with small PFO and positive bubble study. Charlton Haws MD Electronically signed by Charlton Haws MD Signature Date/Time: 04/06/2021/1:45:00 PM    Final    CT HEAD CODE STROKE WO CONTRAST  Result Date: 04/04/2021 CLINICAL DATA:  Code stroke.  Stroke follow-up EXAM: CT HEAD WITHOUT CONTRAST TECHNIQUE: Contiguous axial images were obtained from the base of the skull through the vertex without intravenous contrast. COMPARISON:  Earlier today FINDINGS: Brain: No evidence of acute infarction, hemorrhage, hydrocephalus, extra-axial collection or mass lesion/mass effect. Small remote left cerebellar infarct. Vascular: High-density vessels diffusely in the setting of remote cyst in IV contrast Skull: Normal. Negative for fracture or focal lesion. Sinuses/Orbits: Sinus opacification on a chronic or recurrent basis, incidental to the history. Other: These results were called by telephone at the time of interpretation on 04/04/2021 at  7:41 am to provider [facility] , who verbally acknowledged these results. ASPECTS [facility] Stroke Program Early CT Score) - Ganglionic level infarction (caudate, lentiform nuclei, internal capsule, insula, M1-M3 cortex): 7 - Supraganglionic infarction (M4-M6 cortex): 3 Total score (0-10 with 10 being normal): 10 IMPRESSION: Stable head CT.  ASPECTS remains 10. Electronically Signed   By: Marnee Spring M.D.   On: 04/04/2021 07:42   VAS [place] LOWER EXTREMITY VENOUS (DVT)  Result Date: 04/04/2021  Lower Venous DVT Study Patient Name:  TREMANE SPURGEON  Date of Exam:   04/04/2021 Medical Rec #: [id number]       Accession #:    [id number] Date of Birth: Apr 16, 1966       Patient Gender: M Patient Age:   54Y Exam Location:  [facility] Procedure:      VAS [place] LOWER EXTREMITY VENOUS (DVT) Referring Phys: [id number] Marvel Plan --------------------------------------------------------------------------------  Indications: Stroke.  Comparison Study: no prior Performing Technologist: Blanch Media RVS  Examination Guidelines: A complete evaluation includes B-mode imaging, spectral Doppler, color Doppler, and power Doppler as needed of all accessible portions of each vessel. Bilateral testing is considered an integral part of a complete examination. Limited examinations for reoccurring indications may be performed as noted. The reflux portion of the exam is performed with the patient in reverse Trendelenburg.  +---------+---------------+---------+-----------+----------+--------------+ RIGHT    CompressibilityPhasicitySpontaneityPropertiesThrombus Aging +---------+---------------+---------+-----------+----------+--------------+ CFV      Full           Yes      Yes                                 +---------+---------------+---------+-----------+----------+--------------+  SFJ      Full                                                         +---------+---------------+---------+-----------+----------+--------------+ FV Prox  Full                                                        +---------+---------------+---------+-----------+----------+--------------+ FV Mid   Full                                                        +---------+---------------+---------+-----------+----------+--------------+ FV DistalFull                                                        +---------+---------------+---------+-----------+----------+--------------+ PFV      Full                                                        +---------+---------------+---------+-----------+----------+--------------+ POP      Full           Yes      Yes                                 +---------+---------------+---------+-----------+----------+--------------+ PTV      Full                                                        +---------+---------------+---------+-----------+----------+--------------+ PERO     Full                                                        +---------+---------------+---------+-----------+----------+--------------+   +---------+---------------+---------+-----------+----------+--------------+ LEFT     CompressibilityPhasicitySpontaneityPropertiesThrombus Aging +---------+---------------+---------+-----------+----------+--------------+ CFV      Full           Yes      Yes                                 +---------+---------------+---------+-----------+----------+--------------+ SFJ      Full                                                        +---------+---------------+---------+-----------+----------+--------------+  FV Prox  Full                                                        +---------+---------------+---------+-----------+----------+--------------+ FV Mid   Full                                                         +---------+---------------+---------+-----------+----------+--------------+ FV DistalFull                                                        +---------+---------------+---------+-----------+----------+--------------+ PFV      Full                                                        +---------+---------------+---------+-----------+----------+--------------+ POP      Full           Yes      Yes                                 +---------+---------------+---------+-----------+----------+--------------+ PTV      Full                                                        +---------+---------------+---------+-----------+----------+--------------+ PERO     Full                                                        +---------+---------------+---------+-----------+----------+--------------+     Summary: BILATERAL: - No evidence of deep vein thrombosis seen in the lower extremities, bilaterally. -No evidence of popliteal cyst, bilaterally.   *See table(s) above for measurements and observations. Electronically signed by Sherald Hess MD on 04/04/2021 at 4:58:45 PM.    Final       HISTORY OF PRESENT ILLNESS Josimar Corning is a 55 y.o. male with no significant past medical history of than tobacco abuse who presents with aphasia.  He was last known well around 7 PM, and then on awakening it was noticed that he was having difficulty speaking.  He was taken to [facility] where his NIH was six and a CTA was performed which demonstrated an M1 occlusion.  He was unfortunately outside the tPA window, but given the large vessel occlusion and the significant aphasia he was transferred to [facility] for consideration of thrombectomy.  He did have some improvement en route, but he still had a significant disabling aphasia and therefore thrombectomy was discussed with the  patient and his children and decision was made to proceed.  LKW: 7 PM tpa given?: No, outside of  window IR Thrombectomy?  Yes Modified Rankin Scale: 0-Completely asymptomatic and back to baseline post- stroke NIHSS: 4   HOSPITAL COURSE Mr. Avondre Richens is a 55 y.o. male with history of current smoker admitted for aphasia. No tPA given due to outside window.    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  MRA patent left M1 now but high grade stenosis of left M2 inferior branch  2D Echo EF 55 to 60%  LE venous Doppler no DVT  TEE Mobile atrial septum small  PFO   CT repeat 5/19 decreasing hematoma  LDL 120  HgbA1c 5.7  SCDs for VTE prophylaxis  No antithrombotic prior to admission, now on no antithrombotics due to hemorrhagic conversion. Plan to repeat CT in one week if hematoma resolving will start ASA 81. Discussed with pt and Dr. Corliss Skains  Ongoing aggressive stroke risk factor management  Therapy recommendations: outpt PT  Disposition: home   PFO  TEE showed small PFO with ASA  ROPE score = 6  No significant atherosclerosis on brain imaging  Will refer to Dr. Excell Seltzer cardiology for potential PFO closure  Hypertension  Stable now  BP goal < 160  Off Cleviprex  Long term BP goal normotensive  Hyperlipidemia  Home meds: None  LDL 120, goal < 70  Now on Lipitor 40  Continue statin at discharge  Tobacco abuse  Current heavy smoker  Smoking cessation counseling provided  Pt refused nicotine patch - wants "cold  [place]"  Pt is willing to quit  Other Stroke Risk Factors  UDS positive for THC  Other Active Problems  On keppra before discharge per CCM   DISCHARGE EXAM Blood pressure 110/61, pulse 66, temperature 98.4 F (36.9 C), temperature source Oral, resp. rate 16, height 5\' 4"  (1.626 m), weight 59 kg, SpO2 96 %.  General - Well nourished, well developed, in no apparent distress.  Ophthalmologic - fundi not visualized due to noncooperation.  Cardiovascular - Regular rhythm and rate.  Mental Status -  Level of arousal and orientation to time, place, and person were intact. Language including expression, naming, repetition, comprehension was assessed and found intact. Fund of Knowledge was assessed and was intact.  Cranial Nerves II - XII - II - Visual field intact OU. III, IV, VI - Extraocular movements intact. V - Facial sensation intact bilaterally. VII - Facial movement intact bilaterally. VIII - Hearing & vestibular intact bilaterally. X - Palate elevates symmetrically. XI - Chin turning & shoulder shrug intact bilaterally. XII - Tongue protrusion intact.  Motor Strength - The patient's strength was normal in all extremities and pronator drift was absent.  Bulk was normal and fasciculations were absent.   Motor Tone - Muscle tone was assessed at the neck and appendages and was normal.  Reflexes - The patient's reflexes were symmetrical in all extremities and he had no pathological reflexes.  Sensory -  Light touch, temperature/pinprick were assessed and were symmetrical.    Coordination - The patient had normal movements in the hands with no ataxia or dysmetria.  Tremor was absent.  Gait and Station - deferred.  Discharge Diet       Diet   Diet Heart Room service appropriate? Yes with Assist; Fluid consistency: Thin   liquids  DISCHARGE PLAN  Disposition:  home  No antithrombotic for secondary stroke for now due to hemorrhagic conversion. Will repeat  CT in one week, if stable, will start ASA 81.  Ongoing stroke risk factor control by Primary Care Physician at time of discharge  Follow-up PCP in 2 weeks.  Follow-up in [facility] in 4 weeks, office to schedule an appointment.   Will follow up with Dr. Excell Seltzer for potential PFO closure.  40 minutes were spent preparing discharge.  Marvel Plan, MD PhD Stroke Neurology 04/08/2021 3:44 PM

## 2021-04-07 NOTE — Discharge Instructions (Addendum)
You will be called to schedule an appointment time for a CT of your head to be done about one week from now and then also a Neurology Stroke clinic follow up appointment to be scheduled for about 2 weeks from now. Please keep these very important appointments. If you are not contacted about either please call the Stroke clinic [facility] Neurology) at [phone]     Femoral Site Care This sheet gives you information about how to care for yourself after your procedure. Your health care provider may also give you more specific instructions. If you have problems or questions, contact your health care provider. What can I expect after the procedure? After the procedure, it is common to have:  Bruising that usually fades within 1-2 weeks.  Tenderness at the site. Follow these instructions at home: Wound care 1. Follow instructions from your health care provider about how to take care of your insertion site. Make sure you: ? Wash your hands with soap and water before you change your bandage (dressing). If soap and water are not available, use hand sanitizer. ? Change your dressing as directed- pressure dressing removed 24 hours post-procedure (and switch for bandaid), bandaid removed 72 hours post-procedure 2. Do not take baths, swim, or use a hot tub for 7 days post-procedure. 3. You may shower 48 hours after the procedure or as told by your health care provider. ? Gently wash the site with plain soap and water. ? Pat the area dry with a clean towel. ? Do not rub the site. This may cause bleeding. 4. Check your site every day for signs of infection. Check for: ? Redness, swelling, or pain. ? Fluid or blood. ? Warmth. ? Pus or a bad smell. Activity  Do not stoop, bend, or lift anything that is heavier than 10 lb (4.5 kg) for 2 weeks post-procedure.  Do not drive self for 2 weeks post-procedure. Contact a health care provider if you have:  A fever or chills.  You have redness,  swelling, or pain around your insertion site. Get help right away if:  The catheter insertion area swells very fast.  You pass out.  You suddenly start to sweat or your skin gets clammy.  The catheter insertion area is bleeding, and the bleeding does not stop when you hold steady pressure on the area.  The area near or just beyond the catheter insertion site becomes pale, cool, tingly, or numb. These symptoms may represent a serious problem that is an emergency. Do not wait to see if the symptoms will go away. Get medical help right away. Call your local emergency services (911 in the [place] Do not drive yourself to the hospital.  This information is not intended to replace advice given to you by your health care provider. Make sure you discuss any questions you have with your health care provider. Document Revised: 11/19/2017 Document Reviewed: 11/19/2017 Elsevier Patient Education  2020 [employer].

## 2021-04-07 NOTE — TOC Transition Note (Signed)
Transition of Care [facility]) - CM/SW Discharge Note   Patient Details  Name: Caleb Donaldson MRN: [id number] Date of Birth: 1966-10-17  Transition of Care [facility]) CM/SW Contact:  Kermit Balo, RN Phone Number: 04/07/2021, 11:57 AM   Clinical Narrative:    Patient is discharging home with outpatient therapy. Pt lives in [place], [place] and prefers to attend in [place]. CM will fax orders to [facility] Therapy.  Pt states he has insurance through his job at [employer]. 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 CM provided him packet for the [facility] in case insurance is not active. CM also provided primary care office information on the AVS in case he does have health insurance.  Pt without DME needs.  Pt states he has transport home today.   Final next level of care: OP Rehab Barriers to Discharge: No Barriers Identified   Patient Goals and CMS Choice     Choice offered to / list presented to : Patient  Discharge Placement                       Discharge Plan and Services                                     Social Determinants of Health (SDOH) Interventions     Readmission Risk Interventions No flowsheet data found.

## 2021-04-07 NOTE — Anesthesia Postprocedure Evaluation (Signed)
Anesthesia Post Note  Patient: Caleb Donaldson  Procedure(s) Performed: TRANSESOPHAGEAL ECHOCARDIOGRAM (TEE) (N/A ) BUBBLE STUDY     Patient location during evaluation: PACU Anesthesia Type: MAC Level of consciousness: awake and alert Pain management: pain level controlled Vital Signs Assessment: post-procedure vital signs reviewed and stable Respiratory status: spontaneous breathing Cardiovascular status: stable Anesthetic complications: no   No complications documented.  Last Vitals:  Vitals:   04/07/21 0500 04/07/21 0729  BP: 110/71 110/61  Pulse: 65 66  Resp: 19 16  Temp: 36.9 C 36.9 C  SpO2: 97% 96%    Last Pain:  Vitals:   04/07/21 0729  TempSrc: Oral  PainSc:                  [name]

## 2021-04-08 ENCOUNTER — Encounter (HOSPITAL_COMMUNITY): Payer: Self-pay | Admitting: Cardiovascular Disease

## 2021-04-11 ENCOUNTER — Telehealth: Payer: Self-pay

## 2021-04-11 NOTE — Telephone Encounter (Signed)
Called to arrange PFO consult per Dr. Roda Shutters.  The patient declines to schedule.  He states he will call back if he changes his mind.  Will send PFO information to patient via mail. He was grateful for call.

## 2021-07-12 ENCOUNTER — Other Ambulatory Visit: Payer: Self-pay

## 2021-07-12 NOTE — Patient Outreach (Signed)
[facility]) Care Management  07/12/2021  Veronica Guerrant 03/08/1966 [id number]   First telephone outreach attempt to obtain mRS. No answer. Left message for returned call.  Vanice Sarah [facility] Management Assistant [phone]

## 2021-07-15 ENCOUNTER — Other Ambulatory Visit: Payer: Self-pay

## 2021-07-15 NOTE — Patient Outreach (Signed)
[facility]) Care Management  07/15/2021  Caleb Donaldson 12-Sep-1966 [id number]   Second telephone outreach attempt to obtain mRS. No answer. Unable to leave message for returned call.  [name] Management Assistant [phone]

## 2021-07-18 ENCOUNTER — Other Ambulatory Visit: Payer: Self-pay

## 2021-07-18 NOTE — Patient Outreach (Signed)
[facility]) Care Management  07/18/2021  Guerry Covington 08/26/1966 [id number]   3 outreach attempts were completed to obtain mRs. mRs could not be obtained because patient never returned my calls. mRs=7    Vanice Sarah Care Management Assistant [phone]

## 2022-02-13 ENCOUNTER — Encounter: Payer: Self-pay | Admitting: Neurology

## 2022-03-23 NOTE — Progress Notes (Signed)
? ?NEUROLOGY CONSULTATION NOTE ? ?Caleb Donaldson ?MRN: [id number] ?DOB: September 28, 1966 ? ?Referring provider: Lawson Radar, PA-C ?Primary care provider: Lawson Radar, PA-C ? ?Reason for consult:  stroke ? ?Assessment/Plan:  ? 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 ?Hypertension ?Hyperlipidemia ?Tobacco use disorder/smoker  ? ?Start ASA 81mg  daily ?Secondary stroke prevention as managed by PCP: ?Statin therapy.  LDL goal less than 70 ?Normotensive blood pressure ?Hgb A1c goal less than 7 ?Refer to cardiology for consideration of PFO closure ?Counseled on smoking cessation ?Mediterranean diet ?Routine exercise ?Follow up with PCP regarding management of depression/anxiety ?Follow up with ophthalmology for eye exam ?Follow up with me as needed. ? ? ? ?Subjective:  ?Caleb Donaldson is a 56 year old right-handed male with history of tobacco abuse who presents for history of stroke.  History supplemented by hospital records and referring provider's note.  MRI brain, CTA head and neck and MRA of head personally reviewed. ? ? ?On 04/04/2021, patient woke up that morning with difficulty speaking.  He presented to [facility] where he was noted to have aphasia and NIHSS of 6.  CT perfusion positive for penumbra and CTA of head and neck showed left M1 occlusion.  He was outside window for IV tPA but transferred to [facility] where he underwent thrombectomy (IR with TICI3).  Repeat CT head and follow up MRI of brain revealed small left infarct with left insula/sylvian fissure hematoma and small SAH.  Post-procedural MRA of head showed patent left M1 but high grade stenosis of left M2 inferior branch.  TTE showed EF 55-60% but TEE demonstrated mobile atrial septum indicative of small PFO.  ROPE score 6.  Repeat CT head on 5/19 showed decreasing hematoma.  LDL was 120 and Hgb A1c was 5.7.  UDS was positive for  THC.  Started on Lipitor 40mg .  Due to hemorrhagic conversion, antithrombotics were not yet started.  He was referred to outpatient cardiology for potential PFO closure.  Due to lack of insurance, he wasn't able to follow up with any physicians.  He did not receive any PT/OT.  He has not been taking any antithrombotic therapy.  He still has slight trouble sometimes getting words out and less frequently understanding other people.  He endorses depression and anxiety.  He has trouble seeing.  He still smokes 1 ppd.  Just got Medicaid and established care with a PCP.  Now on atorvastatin and amlodipine ? ?Imaging: ?04/04/2021 CT HEAD WO:  No evidence of acute infarction, hemorrhage, hydrocephalus, extra-axial collection or mass lesion/mass effect. Small remote left cerebellar infarct. ?04/04/2021 CT PERFUSION:  79 cc of left MCA penumbra with a background of 14 cc core infarct. ?04/04/2021 CT HEAD WO:  1. Status post left MCA thrombectomy. In comparison to immediate postprocedural CT scan, interval increase in bulging hyperdensity along the posterior aspect of left sylvian fissure that measures up to 2.6 cm, raising concern for acute subarachnoid hemorrhage (with possible intraparenchymal extension) over contrast staining.  Overlying moderate volume subarachnoid hyperdensity along the left frontoparietal convexity may represent subarachnoid hemorrhage and/or contrast staining.  2. No specific evidence of acute large vascular territory infarct.  MRI could provide more sensitive evaluation in this patient with small infarct seen on recent perfusion. ?04/05/2021 MRI/MRA HEAD WO:  1. Oval 2.7 cm probable combined intra-axial and extra-axial hematoma centered at the Left insula/sylvian fissure(Heidelberg Classification 1C). Small volume superimposed left hemisphere subarachnoid hemorrhage, with  no IVH.  2. Abnormal diffusion in the region of #1, but otherwise only scattered small cortical and subcortical infarcts are  present in the lateral left hemisphere.  3. No significant intracranial mass effect.  No midline shift.  4. Patent Left MCA M1 with residual moderate to severe irregularity at the left MCA bi/trifurcation, most affecting the inferior M2 branch. Elsewhere negative intracranial MRA. ?04/07/2021 CT HEAD WO:  Decreasing bulbous hematoma within the posterior aspect of the left sylvian fissure with increasing surrounding rind of cytotoxic edema within the adjacent insular cortex and anterior pole of the left temporal lobe. Stable minimal mass effect. Improving associated ?subarachnoid hemorrhage in this region. No interval hemorrhage. ? ? ?PAST MEDICAL HISTORY: ?No past medical history on file. ? ?PAST SURGICAL HISTORY: ?Past Surgical History:  ?Procedure Laterality Date  ? BUBBLE STUDY  04/06/2021  ? Procedure: BUBBLE STUDY;  Surgeon: Josue Hector, MD;  Location: [place];  Service: Cardiovascular;;  ? IR CT HEAD LTD  04/04/2021  ? IR PERCUTANEOUS ART THROMBECTOMY/INFUSION INTRACRANIAL INC DIAG ANGIO  04/04/2021  ? TEE WITHOUT CARDIOVERSION N/A 04/06/2021  ? Procedure: TRANSESOPHAGEAL ECHOCARDIOGRAM (TEE);  Surgeon: Josue Hector, MD;  Location: [facility] ENDOSCOPY;  Service: Cardiovascular;  Laterality: N/A;  ? ? ?MEDICATIONS: ?No current outpatient medications on file prior to visit.  ? ?No current facility-administered medications on file prior to visit.  ? ? ?ALLERGIES: ?No Known Allergies ? ?FAMILY HISTORY: ?No family history on file. ? ?Objective:  ?Blood pressure (!) 153/91, pulse 88, height 5\' 4"  (1.626 m), weight 149 lb 3.2 oz (67.7 kg), SpO2 96 %. ?General: No acute distress.  Patient appears well-groomed.   ?Head:  Normocephalic/atraumatic ?Eyes:  fundi examined but not visualized ?Neck: supple, no paraspinal tenderness, full range of motion ?Back: No paraspinal tenderness ?Heart: regular rate and rhythm ?Lungs: Clear to auscultation bilaterally. ?Vascular: No carotid bruits. ?Neurological Exam: ?Mental status:  alert and oriented to person, place, and time, recent and remote memory intact, fund of knowledge intact, attention and concentration intact, speech fluent and not dysarthric, occasional and mild expressive aphasia. ?Cranial nerves: ?CN I: not tested ?CN II: pupils equal, round and reactive to light, visual fields intact ?CN III, IV, VI:  full range of motion, no nystagmus, no ptosis ?CN V: facial sensation intact. ?CN VII: upper and lower face symmetric ?CN VIII: hearing intact ?CN IX, X: gag intact, uvula midline ?CN XI: sternocleidomastoid and trapezius muscles intact ?CN XII: tongue midline ?Bulk & Tone: normal, no fasciculations. ?Motor:  muscle strength 5/5 throughout ?Sensation:  Pinprick and vibratory sensation intact. ?Deep Tendon Reflexes:  2+ throughout,  toes downgoing.   ?Finger to nose testing:  Without dysmetria.   ?Heel to shin:  Without dysmetria.   ?Gait:  Normal station and stride.  Romberg negative. ? ? ? ?Thank you for allowing me to take part in the care of this patient. ? ?Metta Clines, DO ? ?CC: Lawson Radar, NP ? ? ? ? ?

## 2022-03-27 ENCOUNTER — Encounter: Payer: Self-pay | Admitting: Neurology

## 2022-03-27 ENCOUNTER — Ambulatory Visit: Payer: Medicaid Other | Admitting: Neurology

## 2022-03-27 VITALS — BP 153/91 | HR 88 | Ht 64.0 in | Wt 149.2 lb

## 2022-03-27 DIAGNOSIS — Q2112 Patent foramen ovale: Secondary | ICD-10-CM | POA: Diagnosis not present

## 2022-03-27 DIAGNOSIS — Z720 Tobacco use: Secondary | ICD-10-CM

## 2022-03-27 DIAGNOSIS — I10 Essential (primary) hypertension: Secondary | ICD-10-CM | POA: Diagnosis not present

## 2022-03-27 DIAGNOSIS — I63312 Cerebral infarction due to thrombosis of left middle cerebral artery: Secondary | ICD-10-CM

## 2022-03-27 DIAGNOSIS — I69320 Aphasia following cerebral infarction: Secondary | ICD-10-CM | POA: Diagnosis not present

## 2022-03-27 DIAGNOSIS — E785 Hyperlipidemia, unspecified: Secondary | ICD-10-CM

## 2022-03-27 NOTE — Patient Instructions (Signed)
Start taking aspirin 81mg  daily ?Continue atorvastatin and amlodipine as prescribed by your PCP ?Try to quit smoking ?Routine exercise ?Mediterranean diet ?Refer to cardiology for consideration to close the PFO (hole in your heart) ?Follow up with PCP regarding depression and anxiety ?Follow up with eye doctor ?Follow up with me as needed ? ? ?Mediterranean Diet ?A Mediterranean diet refers to food and lifestyle choices that are based on the traditions of countries located on the [employer]. It focuses on eating more fruits, vegetables, whole grains, beans, nuts, seeds, and heart-healthy fats, and eating less dairy, meat, eggs, and processed foods with added sugar, salt, and fat. This way of eating has been shown to help prevent certain conditions and improve outcomes for people who have chronic diseases, like kidney disease and heart disease. ?What are tips for following this plan? ?Reading food labels ?Check the serving size of packaged foods. For foods such as rice and pasta, the serving size refers to the amount of cooked product, not dry. ?Check the total fat in packaged foods. Avoid foods that have saturated fat or trans fats. ?Check the ingredient list for added sugars, such as corn syrup. ?Shopping ? ?Buy a variety of foods that offer a balanced diet, including: ?Fresh fruits and vegetables (produce). ?Grains, beans, nuts, and seeds. Some of these may be available in unpackaged forms or large amounts (in bulk). ?Fresh seafood. ?Poultry and eggs. ?Low-fat dairy products. ?Buy whole ingredients instead of prepackaged foods. ?Buy fresh fruits and vegetables in-season from local farmers markets. ?Buy plain frozen fruits and vegetables. ?If you do not have access to quality fresh seafood, buy precooked frozen shrimp or canned fish, such as tuna, salmon, or sardines. ?Stock your pantry so you always have certain foods on hand, such as olive oil, canned tuna, canned tomatoes, rice, pasta, and  beans. ?Cooking ?Cook foods with extra-virgin olive oil instead of using butter or other vegetable oils. ?Have meat as a side dish, and have vegetables or grains as your main dish. This means having meat in small portions or adding small amounts of meat to foods like pasta or stew. ?Use beans or vegetables instead of meat in common dishes like chili or lasagna. ?Experiment with different cooking methods. Try roasting, broiling, steaming, and saut?ing vegetables. ?Add frozen vegetables to soups, stews, pasta, or rice. ?Add nuts or seeds for added healthy fats and plant protein at each meal. You can add these to yogurt, salads, or vegetable dishes. ?Marinate fish or vegetables using olive oil, lemon juice, garlic, and fresh herbs. ?Meal planning ?Plan to eat one vegetarian meal one day each week. Try to work up to two vegetarian meals, if possible. ?Eat seafood two or more times a week. ?Have healthy snacks readily available, such as: ?Vegetable sticks with hummus. ?[place] yogurt. ?Fruit and nut trail mix. ?Eat balanced meals throughout the week. This includes: ?Fruit: 2-3 servings a day. ?Vegetables: 4-5 servings a day. ?Low-fat dairy: 2 servings a day. ?Fish, poultry, or lean meat: 1 serving a day. ?Beans and legumes: 2 or more servings a week. ?Nuts and seeds: 1-2 servings a day. ?Whole grains: 6-8 servings a day. ?Extra-virgin olive oil: 3-4 servings a day. ?Limit red meat and sweets to only a few servings a month. ?Lifestyle ? ?Cook and eat meals together with your family, when possible. ?Drink enough fluid to keep your urine pale yellow. ?Be physically active every day. This includes: ?Aerobic exercise like running or swimming. ?Leisure activities like gardening, walking, or housework. ?Get  7-8 hours of sleep each night. ?If recommended by your health care provider, drink red wine in moderation. This means 1 glass a day for nonpregnant women and 2 glasses a day for men. A glass of wine equals 5 oz (150 mL). ?What  foods should I eat? ?Fruits ?Apples. Apricots. Avocado. Berries. Bananas. Cherries. Dates. Figs. Grapes. Lemons. Melon. Oranges. Peaches. Plums. Pomegranate. ?Vegetables ?Artichokes. Beets. Broccoli. Cabbage. Carrots. Eggplant. Green beans. Chard. Kale. Spinach. Onions. Leeks. Peas. Squash. Tomatoes. Peppers. Radishes. ?Grains ?Whole-grain pasta. Brown rice. Bulgur wheat. Polenta. Couscous. Whole-wheat bread. [place]. ?Meats and other proteins ?Beans. Almonds. Sunflower seeds. Pine nuts. Peanuts. [place]. Salmon. Scallops. Shrimp. [place]. Tilapia. Clams. Oysters. Eggs. Poultry without skin. ?Dairy ?Low-fat milk. Cheese. Greek yogurt. ?Fats and oils ?Extra-virgin olive oil. Avocado oil. Grapeseed oil. ?Beverages ?Water. Red wine. Herbal tea. ?Sweets and desserts ?Greek yogurt with honey. Baked apples. Poached pears. Trail mix. ?Seasonings and condiments ?[place]. Cilantro. Coriander. Cumin. Mint. Parsley. Sage. Rosemary. Tarragon. Garlic. Oregano. Thyme. Pepper. Balsamic vinegar. Tahini. Hummus. Tomato sauce. Olives. Mushrooms. ?The items listed above may not be a complete list of foods and beverages you can eat. Contact a dietitian for more information. ?What foods should I limit? ?This is a list of foods that should be eaten rarely or only on special occasions. ?Fruits ?Fruit canned in syrup. ?Vegetables ?Deep-fried potatoes (french fries). ?Grains ?Prepackaged pasta or rice dishes. Prepackaged cereal with added sugar. Prepackaged snacks with added sugar. ?Meats and other proteins ?Beef. Pork. Lamb. Poultry with skin. Hot dogs. [place] Sal[place]. ?Dairy ?Ice cream. Sour cream. Whole milk. ?Fats and oils ?Butter. Canola oil. Vegetable oil. Beef fat (tallow). Lard. ?Beverages ?Juice. Sugar-sweetened soft drinks. Beer. Liquor and spirits. ?Sweets and desserts ?Cookies. Cakes. Pies. Candy. ?Seasonings and condiments ?Mayonnaise. Pre-made sauces and marinades. ?The items listed above may not be a complete list of foods and  beverages you should limit. Contact a dietitian for more information. ?Summary ?The Mediterranean diet includes both food and lifestyle choices. ?Eat a variety of fresh fruits and vegetables, beans, nuts, seeds, and whole grains. ?Limit the amount of red meat and sweets that you eat. ?If recommended by your health care provider, drink red wine in moderation. This means 1 glass a day for nonpregnant women and 2 glasses a day for men. A glass of wine equals 5 oz (150 mL). ?This information is not intended to replace advice given to you by your health care provider. Make sure you discuss any questions you have with your health care provider. ?Document Revised: 12/12/2019 Document Reviewed: 10/09/2019 ?Elsevier Patient Education ? [place]. ? ?

## 2022-04-13 ENCOUNTER — Institutional Professional Consult (permissible substitution): Payer: Medicaid Other | Admitting: Cardiovascular Disease

## 2022-05-11 ENCOUNTER — Encounter: Payer: Self-pay | Admitting: Cardiovascular Disease

## 2022-05-11 ENCOUNTER — Ambulatory Visit: Payer: Medicaid Other | Admitting: Cardiovascular Disease

## 2022-05-11 VITALS — BP 122/68 | HR 77 | Ht 64.0 in | Wt 151.0 lb

## 2022-05-11 DIAGNOSIS — Q2112 Patent foramen ovale: Secondary | ICD-10-CM

## 2022-05-11 DIAGNOSIS — Z01810 Encounter for preprocedural cardiovascular examination: Secondary | ICD-10-CM | POA: Diagnosis not present

## 2022-05-11 DIAGNOSIS — I253 Aneurysm of heart: Secondary | ICD-10-CM

## 2022-05-11 MED ORDER — CLOPIDOGREL BISULFATE 75 MG PO TABS: 75.0000 mg | ORAL_TABLET | Freq: Every day | ORAL | 1 refills | Status: AC

## 2022-05-11 NOTE — H&P (View-Only) (Signed)
Cardiology Office Note:    Date:  05/11/2022   ID:  Caleb Donaldson, DOB 01/16/1966, MRN [id number]  PCP:  Buckner Malta, NP   [facility] Providers Cardiologist:  None     Referring MD: Buckner Malta, NP   Chief Complaint  Patient presents with   PFO    History of Present Illness:    Caleb Donaldson is a 56 y.o. male referred by Dr Everlena Ebbie Sorenson for consideration of PFO closure.   The patient is here with his daughter today.  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  His stroke occurred in an embolic pattern.  Work-up during his hospitalization showed no clear source of embolus, but the patient was found to have a highly mobile interatrial septum with a PFO present.  Outpatient follow-up was recommended for consideration of PFO closure, but the patient did not have health insurance.  He got his Medicaid and was recently evaluated by Dr. Everlena Maretta Overdorf with neurology, now referred for consideration of PFO closure.  The patient denies any history of cardiac problems.  He specifically denies chest pain, chest pressure, shortness of breath, or heart palpitations.  Unfortunately he continues to smoke.  He is compliant with his medications for hyperlipidemia and hypertension with good control of his blood pressure.  He denies any history of nickel allergy.  Past Medical History:  Diagnosis Date   Hyperglycemia    Hypertension     Past Surgical History:  Procedure Laterality Date   BUBBLE STUDY  04/06/2021   Procedure: BUBBLE STUDY;  Surgeon: Wendall Stade, MD;  Location: [facility] ENDOSCOPY;  Service: Cardiovascular;;   IR CT HEAD LTD  04/04/2021   IR PERCUTANEOUS ART THROMBECTOMY/INFUSION INTRACRANIAL INC DIAG ANGIO  04/04/2021   TEE WITHOUT CARDIOVERSION N/A 04/06/2021   Procedure: TRANSESOPHAGEAL ECHOCARDIOGRAM (TEE);  Surgeon: Wendall Stade, MD;  Location: [facility] ENDOSCOPY;  Service: Cardiovascular;  Laterality:  N/A;    Current Medications: Current Meds  Medication Sig   amLODipine (NORVASC) 2.5 MG tablet Take 2.5 mg by mouth daily.   aspirin EC 81 MG tablet Take 81 mg by mouth daily. Swallow whole.   atorvastatin (LIPITOR) 20 MG tablet Take 20 mg by mouth at bedtime.   clopidogrel (PLAVIX) 75 MG tablet Take 1 tablet (75 mg total) by mouth daily. Start 5 days prior to procedure (procedure scheduled for 06/07/22)     Allergies:   Patient has no known allergies.   Social History   Socioeconomic History   Marital status: Single    Spouse name: Not on file   Number of children: Not on file   Years of education: Not on file   Highest education level: Not on file  Occupational History   Not on file  Tobacco Use   Smoking status: Every Day    Packs/day: 1.00    Types: Cigarettes   Smokeless tobacco: Never  Substance and Sexual Activity   Alcohol use: Not on file   Drug use: Not on file   Sexual activity: Not on file  Other Topics Concern   Not on file  Social History Narrative   Not on file   Social Determinants of Health   Financial Resource Strain: Not on file  Food Insecurity: Not on file  Transportation Needs: Not on file  Physical Activity: Not on file  Stress: Not on file  Social Connections: Not on file     Family History: The patient's family  history includes Dementia in his mother.  ROS:   Please see the history of present illness.    All other systems reviewed and are negative.  EKGs/Labs/Other Studies Reviewed:    The following studies were reviewed today: TEE 03/2021: 1. Mobile atrial septum with small PFO and positive bubble study.   2. Left ventricular ejection fraction, by estimation, is 60 to 65%. The  left ventricle has normal function.   3. Right ventricular systolic function is normal. The right ventricular  size is normal.   4. No left atrial/left atrial appendage thrombus was detected.   5. Right atrial size was mildly dilated.   6. The mitral  valve is normal in structure. No evidence of mitral valve  regurgitation.   7. The aortic valve is tricuspid. Aortic valve regurgitation is not  visualized. No aortic stenosis is present.   8. Evidence of atrial level shunting detected by color flow Doppler.  Agitated saline contrast bubble study was positive with shunting observed  within 3-6 cardiac cycles suggestive of interatrial shunt.   EKG:  EKG is ordered today.  The ekg ordered today demonstrates normal sinus rhythm 77 bpm, within normal limits.  Recent Labs: No results found for requested labs within last 365 days.  Recent Lipid Panel    Component Value Date/Time   CHOL 176 04/05/2021 0604   TRIG 121 04/05/2021 0604   HDL 32 (L) 04/05/2021 0604   CHOLHDL 5.5 04/05/2021 0604   VLDL 24 04/05/2021 0604   LDLCALC 120 (H) 04/05/2021 0604     Risk Assessment/Calculations:           Physical Exam:    VS:  BP 122/68   Pulse 77   Ht 5' 4" (1.626 m)   Wt 151 lb (68.5 kg)   SpO2 96%   BMI 25.92 kg/m     Wt Readings from Last 3 Encounters:  05/11/22 151 lb (68.5 kg)  03/27/22 149 lb 3.2 oz (67.7 kg)  04/06/21 130 lb (59 kg)     GEN:  Well nourished, well developed in no acute distress HEENT: Normal NECK: No JVD; No carotid bruits LYMPHATICS: No lymphadenopathy CARDIAC: RRR, no murmurs, rubs, gallops RESPIRATORY:  Clear to auscultation without rales, wheezing or rhonchi  ABDOMEN: Soft, non-tender, non-distended MUSCULOSKELETAL:  No edema; No deformity  SKIN: Warm and dry NEUROLOGIC:  Alert and oriented x 3 PSYCHIATRIC:  Normal affect   ASSESSMENT:    1. Pre-procedural cardiovascular examination   2. PFO with atrial septal aneurysm    PLAN:    In order of problems listed above:  The patient's TEE images are personally reviewed.  He has no significant valvular disease, normal LV and RV function, and a moderate-sized PFO with interatrial septal aneurysm.  He has a strongly positive bubble study suggestive  of large right to left shunt.  We discussed the association of PFO with cryptogenic stroke today.  We reviewed available clinical trial data regarding medical therapy to transcatheter PFO closure.  I personally reviewed the transcatheter closure procedure with the patient and his daughter.  I demonstrated the device and the deployment techniques.  We discussed risks, benefits, and alternatives through a shared decision-making model.  Specific risks discussed included bleeding, infection, vascular injury, stroke, myocardial infarction, arrhythmia, device embolization, late device erosion, cardiac injury with cardiac tamponade, and need for emergency surgery.  The patient understands these occur at a low frequency of less than 1% with the exception of atrial fibrillation which may occur as   frequently as 2 to 3% of the time.  After full discussion, he would like to proceed at the next available time. He will be started on plavix 75 mg daily and will need to continue this for a period of 6 months.       Medication Adjustments/Labs and Tests Ordered: Current medicines are reviewed at length with the patient today.  Concerns regarding medicines are outlined above.  Orders Placed This Encounter  Procedures   Basic metabolic panel   CBC   EKG 12-Lead   Meds ordered this encounter  Medications   clopidogrel (PLAVIX) 75 MG tablet    Sig: Take 1 tablet (75 mg total) by mouth daily. Start 5 days prior to procedure (procedure scheduled for 06/07/22)    Dispense:  90 tablet    Refill:  1    Patient Instructions  Medication Instructions:  START Clopidogrel 75mg  daily FIVE (5) days prior to procedure (06/02/22) *If you need a refill on your cardiac medications before your next appointment, please call your pharmacy*   Lab Work: BMET, CBC Today If you have labs (blood work) drawn today and your tests are completely normal, you will receive your results only by: MyChart Message (if you have MyChart) OR A  paper copy in the mail If you have any lab test that is abnormal or we need to change your treatment, we will call you to review the results.   Testing/Procedures: PFO Closure   Follow-Up: At [facility], you and your health needs are our priority.  As part of our continuing mission to provide you with exceptional heart care, we have created designated Provider Care Teams.  These Care Teams include your primary Cardiologist (physician) and Advanced Practice Providers (APPs -  Physician Assistants and Nurse Practitioners) who all work together to provide you with the care you need, when you need it.  Your next appointment:   Structural Team will follow-up  Provider:   [facility], MD     Important Information About Sugar         Signed, Tonny Bollman, MD  05/11/2022 5:26 PM    [facility]

## 2022-05-11 NOTE — Patient Instructions (Signed)
Medication Instructions:  START Clopidogrel 75mg  daily FIVE (5) days prior to procedure (06/02/22) *If you need a refill on your cardiac medications before your next appointment, please call your pharmacy*   Lab Work: BMET, CBC Today If you have labs (blood work) drawn today and your tests are completely normal, you will receive your results only by: MyChart Message (if you have MyChart) OR A paper copy in the mail If you have any lab test that is abnormal or we need to change your treatment, we will call you to review the results.   Testing/Procedures: PFO Closure   Follow-Up: At [facility], you and your health needs are our priority.  As part of our continuing mission to provide you with exceptional heart care, we have created designated Provider Care Teams.  These Care Teams include your primary Cardiologist (physician) and Advanced Practice Providers (APPs -  Physician Assistants and Nurse Practitioners) who all work together to provide you with the care you need, when you need it.  Your next appointment:   Structural Team will follow-up  Provider:   [facility], MD     Important Information About Sugar

## 2022-05-11 NOTE — Progress Notes (Signed)
Cardiology Office Note:    Date:  05/11/2022   ID:  Caleb Donaldson, DOB 01/16/1966, MRN [id number]  PCP:  Buckner Malta, NP   [facility] Providers Cardiologist:  None     Referring MD: Buckner Malta, NP   Chief Complaint  Patient presents with   PFO    History of Present Illness:    Caleb Donaldson is a 56 y.o. male referred by Dr Everlena Coltan Spinello for consideration of PFO closure.   The patient is here with his daughter today.  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  His stroke occurred in an embolic pattern.  Work-up during his hospitalization showed no clear source of embolus, but the patient was found to have a highly mobile interatrial septum with a PFO present.  Outpatient follow-up was recommended for consideration of PFO closure, but the patient did not have health insurance.  He got his Medicaid and was recently evaluated by Dr. Everlena Leilan Bochenek with neurology, now referred for consideration of PFO closure.  The patient denies any history of cardiac problems.  He specifically denies chest pain, chest pressure, shortness of breath, or heart palpitations.  Unfortunately he continues to smoke.  He is compliant with his medications for hyperlipidemia and hypertension with good control of his blood pressure.  He denies any history of nickel allergy.  Past Medical History:  Diagnosis Date   Hyperglycemia    Hypertension     Past Surgical History:  Procedure Laterality Date   BUBBLE STUDY  04/06/2021   Procedure: BUBBLE STUDY;  Surgeon: Wendall Stade, MD;  Location: [facility] ENDOSCOPY;  Service: Cardiovascular;;   IR CT HEAD LTD  04/04/2021   IR PERCUTANEOUS ART THROMBECTOMY/INFUSION INTRACRANIAL INC DIAG ANGIO  04/04/2021   TEE WITHOUT CARDIOVERSION N/A 04/06/2021   Procedure: TRANSESOPHAGEAL ECHOCARDIOGRAM (TEE);  Surgeon: Wendall Stade, MD;  Location: [facility] ENDOSCOPY;  Service: Cardiovascular;  Laterality:  N/A;    Current Medications: Current Meds  Medication Sig   amLODipine (NORVASC) 2.5 MG tablet Take 2.5 mg by mouth daily.   aspirin EC 81 MG tablet Take 81 mg by mouth daily. Swallow whole.   atorvastatin (LIPITOR) 20 MG tablet Take 20 mg by mouth at bedtime.   clopidogrel (PLAVIX) 75 MG tablet Take 1 tablet (75 mg total) by mouth daily. Start 5 days prior to procedure (procedure scheduled for 06/07/22)     Allergies:   Patient has no known allergies.   Social History   Socioeconomic History   Marital status: Single    Spouse name: Not on file   Number of children: Not on file   Years of education: Not on file   Highest education level: Not on file  Occupational History   Not on file  Tobacco Use   Smoking status: Every Day    Packs/day: 1.00    Types: Cigarettes   Smokeless tobacco: Never  Substance and Sexual Activity   Alcohol use: Not on file   Drug use: Not on file   Sexual activity: Not on file  Other Topics Concern   Not on file  Social History Narrative   Not on file   Social Determinants of Health   Financial Resource Strain: Not on file  Food Insecurity: Not on file  Transportation Needs: Not on file  Physical Activity: Not on file  Stress: Not on file  Social Connections: Not on file     Family History: The patient's family  history includes Dementia in his mother.  ROS:   Please see the history of present illness.    All other systems reviewed and are negative.  EKGs/Labs/Other Studies Reviewed:    The following studies were reviewed today: TEE 2021/04/09: 1. Mobile atrial septum with small PFO and positive bubble study.   2. Left ventricular ejection fraction, by estimation, is 60 to 65%. The  left ventricle has normal function.   3. Right ventricular systolic function is normal. The right ventricular  size is normal.   4. No left atrial/left atrial appendage thrombus was detected.   5. Right atrial size was mildly dilated.   6. The mitral  valve is normal in structure. No evidence of mitral valve  regurgitation.   7. The aortic valve is tricuspid. Aortic valve regurgitation is not  visualized. No aortic stenosis is present.   8. Evidence of atrial level shunting detected by color flow Doppler.  Agitated saline contrast bubble study was positive with shunting observed  within 3-6 cardiac cycles suggestive of interatrial shunt.   EKG:  EKG is ordered today.  The ekg ordered today demonstrates normal sinus rhythm 77 bpm, within normal limits.  Recent Labs: No results found for requested labs within last 365 days.  Recent Lipid Panel    Component Value Date/Time   CHOL 176 04/05/2021 0604   TRIG 121 04/05/2021 0604   HDL 32 (L) 04/05/2021 0604   CHOLHDL 5.5 04/05/2021 0604   VLDL 24 04/05/2021 0604   LDLCALC 120 (H) 04/05/2021 0604     Risk Assessment/Calculations:           Physical Exam:    VS:  BP 122/68   Pulse 77   Ht 5\' 4"  (1.626 m)   Wt 151 lb (68.5 kg)   SpO2 96%   BMI 25.92 kg/m     Wt Readings from Last 3 Encounters:  05/11/22 151 lb (68.5 kg)  03/27/22 149 lb 3.2 oz (67.7 kg)  04/06/21 130 lb (59 kg)     GEN:  Well nourished, well developed in no acute distress HEENT: Normal NECK: No JVD; No carotid bruits LYMPHATICS: No lymphadenopathy CARDIAC: RRR, no murmurs, rubs, gallops RESPIRATORY:  Clear to auscultation without rales, wheezing or rhonchi  ABDOMEN: Soft, non-tender, non-distended MUSCULOSKELETAL:  No edema; No deformity  SKIN: Warm and dry NEUROLOGIC:  Alert and oriented x 3 PSYCHIATRIC:  Normal affect   ASSESSMENT:    1. Pre-procedural cardiovascular examination   2. PFO with atrial septal aneurysm    PLAN:    In order of problems listed above:  The patient's TEE images are personally reviewed.  He has no significant valvular disease, normal LV and RV function, and a moderate-sized PFO with interatrial septal aneurysm.  He has a strongly positive bubble study suggestive  of large right to left shunt.  We discussed the association of PFO with cryptogenic stroke today.  We reviewed available clinical trial data regarding medical therapy to transcatheter PFO closure.  I personally reviewed the transcatheter closure procedure with the patient and his daughter.  I demonstrated the device and the deployment techniques.  We discussed risks, benefits, and alternatives through a shared decision-making model.  Specific risks discussed included bleeding, infection, vascular injury, stroke, myocardial infarction, arrhythmia, device embolization, late device erosion, cardiac injury with cardiac tamponade, and need for emergency surgery.  The patient understands these occur at a low frequency of less than 1% with the exception of atrial fibrillation which may occur as  frequently as 2 to 3% of the time.  After full discussion, he would like to proceed at the next available time. He will be started on plavix 75 mg daily and will need to continue this for a period of 6 months.       Medication Adjustments/Labs and Tests Ordered: Current medicines are reviewed at length with the patient today.  Concerns regarding medicines are outlined above.  Orders Placed This Encounter  Procedures   Basic metabolic panel   CBC   EKG 12-Lead   Meds ordered this encounter  Medications   clopidogrel (PLAVIX) 75 MG tablet    Sig: Take 1 tablet (75 mg total) by mouth daily. Start 5 days prior to procedure (procedure scheduled for 06/07/22)    Dispense:  90 tablet    Refill:  1    Patient Instructions  Medication Instructions:  START Clopidogrel 75mg  daily FIVE (5) days prior to procedure (06/02/22) *If you need a refill on your cardiac medications before your next appointment, please call your pharmacy*   Lab Work: BMET, CBC Today If you have labs (blood work) drawn today and your tests are completely normal, you will receive your results only by: MyChart Message (if you have MyChart) OR A  paper copy in the mail If you have any lab test that is abnormal or we need to change your treatment, we will call you to review the results.   Testing/Procedures: PFO Closure   Follow-Up: At [facility], you and your health needs are our priority.  As part of our continuing mission to provide you with exceptional heart care, we have created designated Provider Care Teams.  These Care Teams include your primary Cardiologist (physician) and Advanced Practice Providers (APPs -  Physician Assistants and Nurse Practitioners) who all work together to provide you with the care you need, when you need it.  Your next appointment:   Structural Team will follow-up  Provider:   [facility], MD     Important Information About Sugar         Signed, Tonny Bollman, MD  05/11/2022 5:26 PM    [facility]

## 2022-05-12 LAB — BASIC METABOLIC PANEL
BUN/Creatinine Ratio: 6 — ABNORMAL LOW (ref 9–20)
BUN: 6 mg/dL (ref 6–24)
CO2: 23 mmol/L (ref 20–29)
Calcium: 9.4 mg/dL (ref 8.7–10.2)
Chloride: 102 mmol/L (ref 96–106)
Creatinine, Ser: 0.97 mg/dL (ref 0.76–1.27)
Glucose: 96 mg/dL (ref 70–99)
Potassium: 4.5 mmol/L (ref 3.5–5.2)
Sodium: 139 mmol/L (ref 134–144)
eGFR: 91 mL/min/{1.73_m2} (ref >59)

## 2022-05-12 LAB — CBC
Hematocrit: 48.5 % (ref 37.5–51.0)
Hemoglobin: 16.8 g/dL (ref 13.0–17.7)
MCH: 30.7 pg (ref 26.6–33.0)
MCHC: 34.6 g/dL (ref 31.5–35.7)
MCV: 89 fL (ref 79–97)
Platelets: 273 10*3/uL (ref 150–450)
RBC: 5.48 x10E6/uL (ref 4.14–5.80)
RDW: 11.7 % (ref 11.6–15.4)
WBC: 7.7 10*3/uL (ref 3.4–10.8)

## 2022-06-06 ENCOUNTER — Telehealth: Payer: Self-pay | Admitting: *Deleted

## 2022-06-06 NOTE — Telephone Encounter (Signed)
Unable to reach patient at phone number listed for him /unable to reach patient's daughter (DPR) at phone number listed.

## 2022-06-06 NOTE — Telephone Encounter (Signed)
PFO Closure scheduled at [facility] for: Wednesday June 07, 2022 12:30 PM Arrival time and place: [facility] Main Entrance A at: 10:30 AM   Nothing to eat after midnight prior to procedure, clear liquids until 5 AM day of procedure.  Medication instructions: -Usual morning medications can be taken with sips of water including aspirin 81 mg and Plavix 75 mg.  Confirmed patient has responsible adult to drive home post procedure and be with patient first 24 hours after arriving home.  Patient reports no new symptoms concerning for COVID-19 in the past 10 days.  Call placed to patient to review instructions, fast ring, not able to connect call.

## 2022-06-06 NOTE — Telephone Encounter (Signed)
Several attempts were made all day to reach the patient to review instructions for PFO closure tomorrow and to instruct him to come at 0830 for 1030 procedure due to cancellation. PCP Buckner Malta) contacted and I was told he is not a patient there.  Spoke with the Cath lab and explained issue. Will leave the patient scheduled at 1230 and hope he comes for procedure and follows directions as given at his visit with Dr. Excell Seltzer.   [employer] rep notified of schedule change.

## 2022-06-06 NOTE — Telephone Encounter (Signed)
I have attempted to call every number on file several times and only receive beeping from the other end on all numbers.   Texted every number on file. I have received no text back.  Attempted to call PCP to see if they have a different number on file. They are closed for lunch and will try again soon.

## 2022-06-07 ENCOUNTER — Ambulatory Visit (HOSPITAL_BASED_OUTPATIENT_CLINIC_OR_DEPARTMENT_OTHER): Payer: Medicaid Other

## 2022-06-07 ENCOUNTER — Ambulatory Visit (HOSPITAL_COMMUNITY)
Admission: RE | Admit: 2022-06-07 | Discharge: 2022-06-07 | Disposition: A | Discharge status: Home / Self Care | Payer: Medicaid Other | Attending: Cardiovascular Disease | Admitting: Cardiovascular Disease

## 2022-06-07 ENCOUNTER — Other Ambulatory Visit: Payer: Self-pay

## 2022-06-07 ENCOUNTER — Encounter (HOSPITAL_COMMUNITY)
Admission: RE | Disposition: A | Discharge status: Home / Self Care | Payer: Self-pay | Source: Home / Self Care | Attending: Cardiovascular Disease

## 2022-06-07 DIAGNOSIS — E785 Hyperlipidemia, unspecified: Secondary | ICD-10-CM | POA: Diagnosis not present

## 2022-06-07 DIAGNOSIS — Q2112 Patent foramen ovale: Secondary | ICD-10-CM

## 2022-06-07 DIAGNOSIS — F17210 Nicotine dependence, cigarettes, uncomplicated: Secondary | ICD-10-CM | POA: Insufficient documentation

## 2022-06-07 DIAGNOSIS — I10 Essential (primary) hypertension: Secondary | ICD-10-CM | POA: Insufficient documentation

## 2022-06-07 HISTORY — PX: PATENT FORAMEN OVALE(PFO) CLOSURE: CATH118300

## 2022-06-07 LAB — ECHOCARDIOGRAM LIMITED
Height: 64 in
Weight: 2479.73 oz

## 2022-06-07 LAB — POCT ACTIVATED CLOTTING TIME: Activated Clotting Time: 275 seconds

## 2022-06-07 SURGERY — PATENT FORAMEN OVALE (PFO) CLOSURE
Anesthesia: LOCAL

## 2022-06-07 MED ORDER — ACETAMINOPHEN 325 MG PO TABS
650.0000 mg | ORAL_TABLET | ORAL | Status: DC | PRN
Start: 2022-06-07 — End: 2022-06-07

## 2022-06-07 MED ORDER — CLOPIDOGREL BISULFATE 75 MG PO TABS: 75.0000 mg | ORAL_TABLET | ORAL | Status: DC

## 2022-06-07 MED ORDER — SODIUM CHLORIDE 0.9% FLUSH: 3.0000 mL | Freq: Two times a day (BID) | INTRAVENOUS | Status: DC

## 2022-06-07 MED ORDER — SODIUM CHLORIDE 0.9 % IV SOLN: 250.0000 mL | INTRAVENOUS | Status: DC | PRN

## 2022-06-07 MED ORDER — MIDAZOLAM HCL 2 MG/2ML IJ SOLN
INTRAMUSCULAR | Status: AC
  Filled 2022-06-07: qty 2

## 2022-06-07 MED ORDER — CEFAZOLIN SODIUM-DEXTROSE 2-4 GM/100ML-% IV SOLN
2.0000 g | INTRAVENOUS | Status: DC
  Filled 2022-06-07 (×2): qty 100

## 2022-06-07 MED ORDER — LIDOCAINE HCL (PF) 1 % IJ SOLN
INTRAMUSCULAR | Status: DC | PRN
  Administered 2022-06-07: 30 mL via INTRADERMAL

## 2022-06-07 MED ORDER — MIDAZOLAM HCL 2 MG/2ML IJ SOLN
INTRAMUSCULAR | Status: DC | PRN
  Administered 2022-06-07: 1 mg via INTRAVENOUS
  Administered 2022-06-07: 2 mg via INTRAVENOUS

## 2022-06-07 MED ORDER — LABETALOL HCL 5 MG/ML IV SOLN: 10.0000 mg | INTRAVENOUS | Status: DC | PRN

## 2022-06-07 MED ORDER — SODIUM CHLORIDE 0.9 % IV SOLN: INTRAVENOUS | Status: AC

## 2022-06-07 MED ORDER — SODIUM CHLORIDE 0.9% FLUSH: 3.0000 mL | INTRAVENOUS | Status: DC | PRN

## 2022-06-07 MED ORDER — SODIUM CHLORIDE 0.9 % WEIGHT BASED INFUSION
210.0000 mL/h | INTRAVENOUS | Status: AC
  Administered 2022-06-07: 3 mL/kg/h via INTRAVENOUS

## 2022-06-07 MED ORDER — SODIUM CHLORIDE 0.9 % WEIGHT BASED INFUSION: 70.0000 mL/h | INTRAVENOUS | Status: DC

## 2022-06-07 MED ORDER — HEPARIN SODIUM (PORCINE) 1000 UNIT/ML IJ SOLN
INTRAMUSCULAR | Status: DC | PRN
  Administered 2022-06-07: 6000 [IU] via INTRAVENOUS

## 2022-06-07 MED ORDER — ASPIRIN 81 MG PO CHEW: 81.0000 mg | CHEWABLE_TABLET | ORAL | Status: DC

## 2022-06-07 MED ORDER — FENTANYL CITRATE (PF) 100 MCG/2ML IJ SOLN
INTRAMUSCULAR | Status: DC | PRN
  Administered 2022-06-07 (×2): 25 ug via INTRAVENOUS

## 2022-06-07 MED ORDER — HEPARIN (PORCINE) IN NACL 1000-0.9 UT/500ML-% IV SOLN
INTRAVENOUS | Status: DC | PRN
  Administered 2022-06-07: 500 mL

## 2022-06-07 MED ORDER — ONDANSETRON HCL 4 MG/2ML IJ SOLN: 4.0000 mg | Freq: Four times a day (QID) | INTRAMUSCULAR | Status: DC | PRN

## 2022-06-07 MED ORDER — FENTANYL CITRATE (PF) 100 MCG/2ML IJ SOLN
INTRAMUSCULAR | Status: AC
  Filled 2022-06-07: qty 2

## 2022-06-07 MED ORDER — SODIUM CHLORIDE 0.9% FLUSH
3.0000 mL | Freq: Two times a day (BID) | INTRAVENOUS | Status: DC
Start: 2022-06-07 — End: 2022-06-07

## 2022-06-07 MED ORDER — HEPARIN (PORCINE) IN NACL 1000-0.9 UT/500ML-% IV SOLN
INTRAVENOUS | Status: AC
  Filled 2022-06-07: qty 1000

## 2022-06-07 MED ORDER — HEPARIN SODIUM (PORCINE) 1000 UNIT/ML IJ SOLN
INTRAMUSCULAR | Status: AC
  Filled 2022-06-07: qty 10

## 2022-06-07 MED ORDER — HYDRALAZINE HCL 20 MG/ML IJ SOLN: 10.0000 mg | INTRAMUSCULAR | Status: DC | PRN

## 2022-06-07 MED ORDER — LIDOCAINE HCL (PF) 1 % IJ SOLN
INTRAMUSCULAR | Status: AC
  Filled 2022-06-07: qty 30

## 2022-06-07 SURGICAL SUPPLY — 16 items
CATH 8FR ACUNAV REPROCESSED (CATHETERS) IMPLANT
CATH REPROCESSED 8FR ACUNAV (CATHETERS) ×2 IMPLANT
CATH SUPER TORQUE PLUS 6F MPA1 (CATHETERS) ×1 IMPLANT
CLOSURE PERCLOSE PROSTYLE (VASCULAR PRODUCTS) ×2 IMPLANT
COVER SWIFTLINK CONNECTOR (BAG) ×1 IMPLANT
GUIDEWIRE AMPLATZER 1.5JX260 (WIRE) ×1 IMPLANT
KIT MICROPUNCTURE NIT STIFF (SHEATH) ×1 IMPLANT
OCCLUDER PFO TALISMAN 25-18 (Prosthesis & Implant Heart) IMPLANT
PACK CARDIAC CATHETERIZATION (CUSTOM PROCEDURE TRAY) ×2 IMPLANT
SHEATH DELIVERY TALISMAN 8F 80 (SHEATH) IMPLANT
SHEATH INTROD W/O MIN 9FR 25CM (SHEATH) ×1 IMPLANT
SHEATH PINNACLE 8F 10CM (SHEATH) ×1 IMPLANT
SHEATH PROBE COVER 6X72 (BAG) ×1 IMPLANT
TALISMAN DELIVERY SHEATH 8F 80 (SHEATH) ×2
TALISMAN PFO OCCLUDER 25-18 (Prosthesis & Implant Heart) ×2 IMPLANT
WIRE EMERALD 3MM-J .035X150CM (WIRE) ×2 IMPLANT

## 2022-06-07 NOTE — Discharge Instructions (Signed)

## 2022-06-07 NOTE — Progress Notes (Signed)
  [facility]   MULTIDISCIPLINARY HEART VALVE TEAM  Patient underwent PFO closure with 54mm Abbott Talisman occluder device without complication. Patient with some mild re-bleeding post procedure. Groin care reviewed with patient and family at bedside with understanding. Medication plan for DAPT with ASA and Plavix for 6 months then indefinite ASA 81mg  QD. No SBE as he is edentulous with dentures.    NP-C Structural Heart Team  Pager: [phone] Phone: [phone]

## 2022-06-07 NOTE — Interval H&P Note (Signed)
History and Physical Interval Note:  06/07/2022 11:10 AM  Caleb Donaldson  has presented today for surgery, with the diagnosis of pfo.  The various methods of treatment have been discussed with the patient and family. After consideration of risks, benefits and other options for treatment, the patient has consented to  Procedure(s): PATENT FORAMEN OVALE(PFO) CLOSURE (N/A) as a surgical intervention.  The patient's history has been reviewed, patient examined, no change in status, stable for surgery.  I have reviewed the patient's chart and labs.  Questions were answered to the patient's satisfaction.     Tonny Bollman

## 2022-06-08 ENCOUNTER — Encounter (HOSPITAL_COMMUNITY): Payer: Self-pay | Admitting: Cardiovascular Disease

## 2022-06-08 IMAGING — MR MR MRA HEAD W/O CM
12 of 14 series · 34 of 48 positions shown · non-contrast
Comparison: No pertinent prior exam.
COMPARISON: Post left MCA thrombectomy CT 8112 hours yesterday, and
earlier.

CLINICAL DATA: 54-year-old male status post code stroke
presentation yesterday with left M1 ABSHIR status Raidel Diggs with
suspicion of acute hemorrhage on postprocedure CT.

EXAM:
MRI HEAD WITHOUT CONTRAST
MRA HEAD WITHOUT CONTRAST
TECHNIQUE: Multiplanar, multi-echo pulse sequences of the brain and surrounding
structures were acquired without intravenous contrast. Angiographic
images of the Circle of Willis were acquired using MRA technique
without intravenous contrast.

[Series 5: DWI · axial · 3.0mm · 0.88mm/px · z∈[-64,+80]mm · 6 of 100 slices shown (1 of 4)]
[im 1/100]
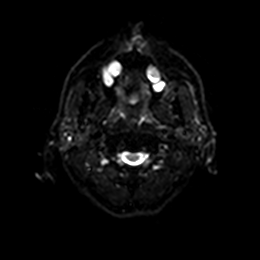
[im 20/100]
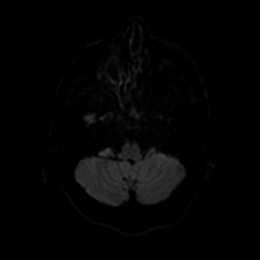
[im 40/100]
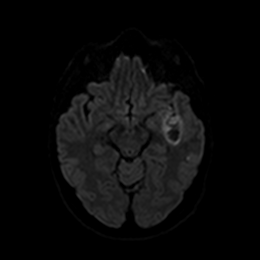
[im 60/100]
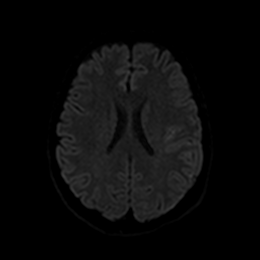
[im 80/100]
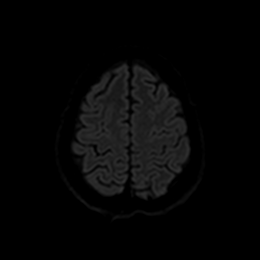
[im 100/100]
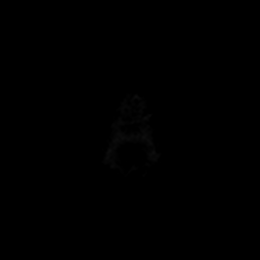

[Series 6: DWI · axial · 3.0mm · 0.88mm/px · z∈[-64,+80]mm · 3 of 49 slices shown (2 of 4)]
[im 1/49]
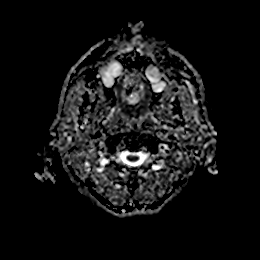
[im 25/49]
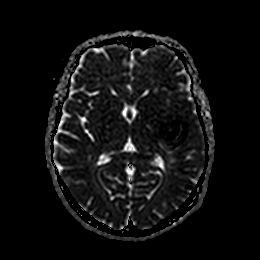
[im 49/49]
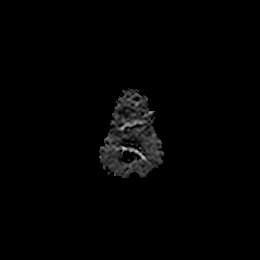

[Series 7: DWI · coronal · 4.0mm · 0.88mm/px · 4 of 70 slices shown (3 of 4)]
[im 1/70]
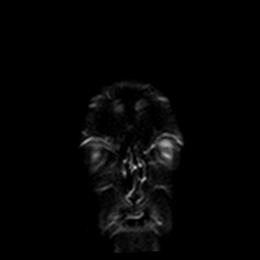
[im 24/70]
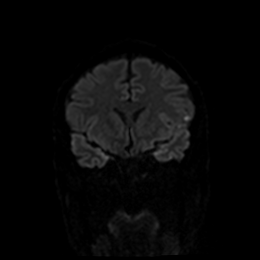
[im 47/70]
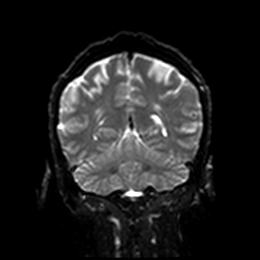
[im 70/70]
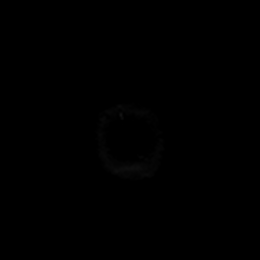

[Series 8: DWI · coronal · 4.0mm · 0.88mm/px · 2 of 35 slices shown (4 of 4)]
[im 1/35]
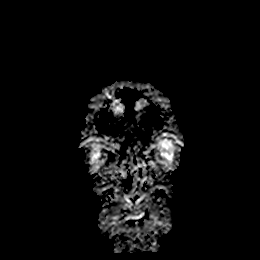
[im 35/35]
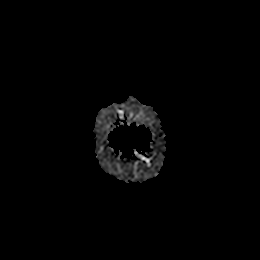

[Series 13: T1 · sagittal · 5.0mm · 0.75mm/px · 1 of 23 slices shown]
[im 1/23]
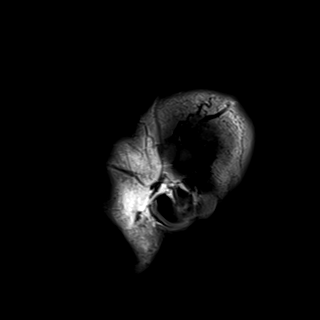

[Series 14: T2 · axial · 5.0mm · 0.72mm/px · z∈[-63,+79]mm · 2 of 25 slices shown (1 of 2)]
[im 1/25]
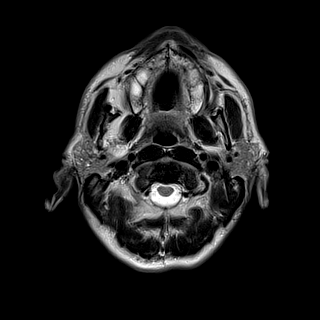
[im 25/25]
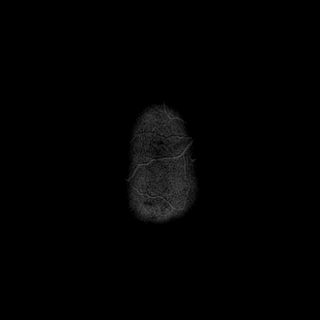

[Series 15: FLAIR · axial · 5.0mm · 0.45mm/px · z∈[-65,+76]mm · 2 of 25 slices shown]
[im 1/25]
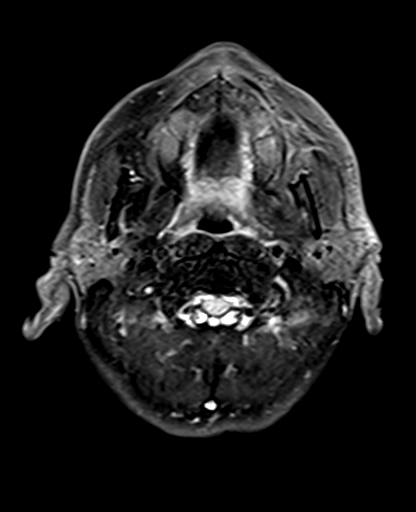
[im 25/25]
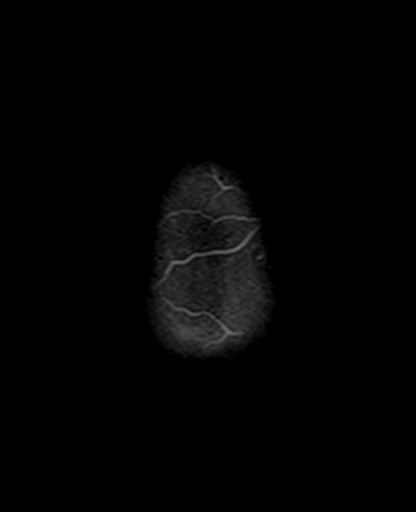

[Series 17: mag_images · axial · 3.0mm · 0.90mm/px · z∈[-70,+81]mm · 3 of 52 slices shown]
[im 1/52]
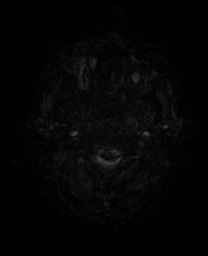
[im 26/52]
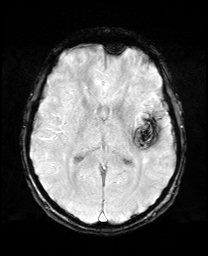
[im 52/52]
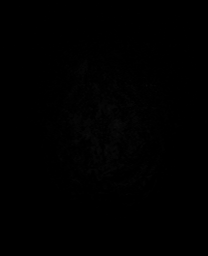

[Series 18: pha_images · axial · 3.0mm · 0.90mm/px · z∈[-70,+75]mm · 3 of 49 slices shown]
[im 1/49]
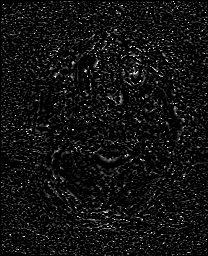
[im 25/49]
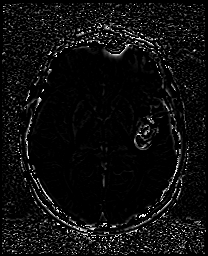
[im 49/49]
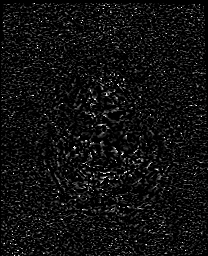

[Series 19: swi_images · axial · 3.0mm · 0.90mm/px · z∈[-70,+81]mm · 3 of 52 slices shown]
[im 1/52]
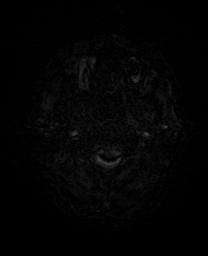
[im 26/52]
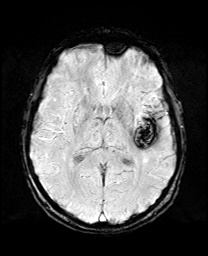
[im 52/52]
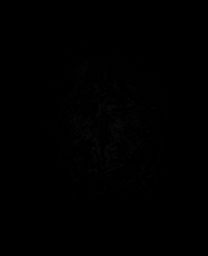

[Series 20: mip_images(sw) · axial · 24.0mm · 0.90mm/px · z∈[-60,+70]mm · 3 of 45 slices shown]
[im 1/45]
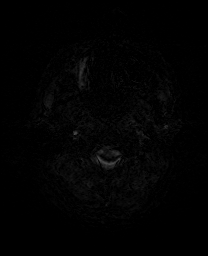
[im 23/45]
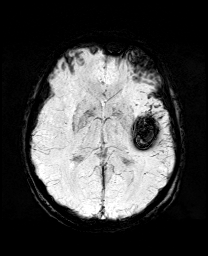
[im 45/45]
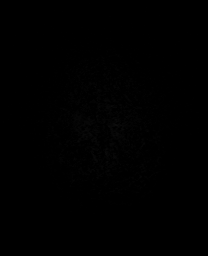

[Series 21: T2 · coronal · 5.0mm · 0.34mm/px · 2 of 29 slices shown (2 of 2)]
[im 1/29]
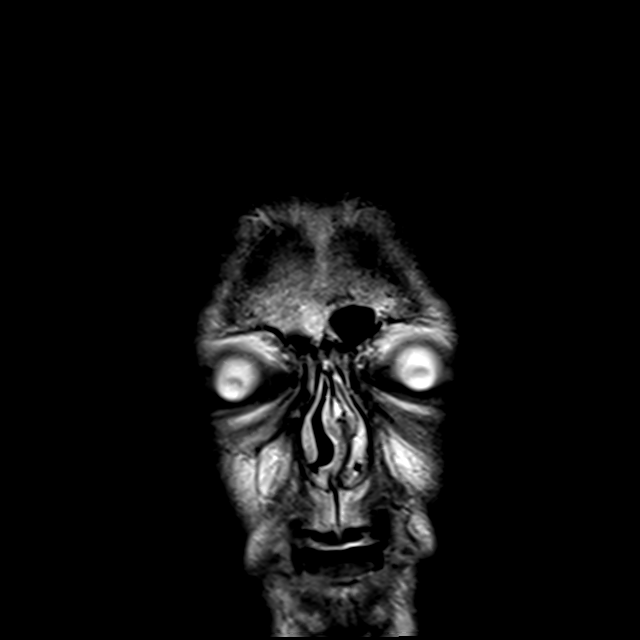
[im 29/29]
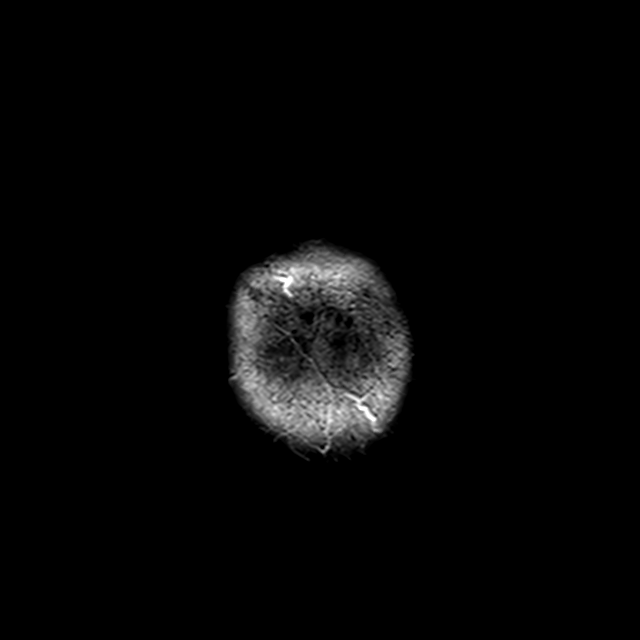

[34 of 48 positions shown; findings below may reference images not displayed]

FINDINGS: MRI HEAD FINDINGS

Brain: Rounded area of intra-axial versus extra-axial mix signal
intensity blood in the left sylvian fissure is oval measuring about
2.7 cm long axis, affecting the left insula. Mostly hyperintense T2
and decreased T1 signal intensity, susceptibility on SWI, and
abnormal diffusion. Superimposed abnormal FLAIR hyperintensity
within sulci of the left hemisphere, but no intraventricular blood
is identified.

On DWI there is abnormal diffusion and/or abnormal susceptibility
associated with the left sylvian fissure lesion plus scattered small
foci of cortical and subcortical white matter diffusion restriction
from the level of the left operculum to the posterior left temporal
lobe, junction with the inferior parietal lobe. Left basal ganglia
largely spared.

No contralateral right hemisphere or posterior fossa restricted
diffusion. No ventriculomegaly or midline shift. Widespread
nonspecific patchy bilateral cerebral white matter T2 and FLAIR
hyperintensity.

Small chronic infarct in the posterior left cerebellum near the
torcula. No other no chronic cortical encephalomalacia identified.
Occasional punctate chronic microhemorrhages are possible. Basilar
cisterns remain normal. Cervicomedullary junction and pituitary are
within normal limits.

Vascular: Major intracranial vascular flow voids are preserved.

Skull and upper cervical spine: Negative.

Sinuses/Orbits: Negative orbits soft tissues. Paranasal sinus fluid
and mucosal thickening bilaterally worst in the right maxillary
sinus.

Other: Mastoids are well aerated. Negative visible scalp and face
soft tissues.

MRA HEAD FINDINGS

Anterior circulation: Antegrade flow in the posterior circulation
with mildly dominant distal left vertebral artery. Normal PICA
origins and vertebrobasilar junction. Patent basilar artery, AICA
and SCA origins. Normal right PCA origin. Fetal type left PCA
origin. Bilateral PCA branches are within normal limits.

Antegrade flow in both ICA siphons. Mild irregularity of the
supraclinoid segments but no significant siphon stenosis. Ophthalmic
and left posterior communicating artery origin are within normal
limits. Patent carotid termini, MCA and ACA origins. Anterior
communicating artery with a median artery of the corpus callosum is
within normal limits. Right MCA M1, bifurcation and visible right
MCA branches are within normal limits. Visible ACA branches are
within normal limits.

Left MCA M1 is patent. The left MCA bifurcation is patent but
irregular as seen on series 6838, image 3. There is severe stenosis
of the inferior left MCA M2 division (series 7097, image 6), but no
discrete left MCA branch occlusion is identified.
IMPRESSION: 1. Oval 2.7 cm probable combined intra-axial and extra-axial
hematoma centered at the Left insula/sylvian fissure(Heidelberg
Classification 1C). Small volume superimposed left hemisphere
subarachnoid hemorrhage, with no IVH.

2. Abnormal diffusion in the region of #1, but otherwise only
scattered small cortical and subcortical infarcts are present in the
lateral left hemisphere.

3. No significant intracranial mass effect.  No midline shift.

4. Patent Left MCA M1 with residual moderate to severe irregularity
at the left MCA bi/trifurcation, most affecting the inferior M2
branch. Elsewhere negative intracranial MRA.

## 2022-07-10 NOTE — Progress Notes (Unsigned)
[facility] [place]                                     Cardiology Office Note:    Date:  07/13/2022   ID:  Caleb Donaldson, DOB 04-27-1966, MRN [id number]  PCP:  Lawson Radar, NP  [facility] Cardiologist:  None  [facility] Electrophysiologist:  None   Referring MD: Lawson Radar, NP   1 most s/p PFO closure   History of Present Illness:    Caleb Donaldson is a 56 y.o. male with a hx of HTN, CVA and PFO s/p PFO closure (06/07/22) who presents to clinic for follow up.   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 His stroke occurred in an embolic pattern.  Work-up during his hospitalization showed no clear source of embolus, but the patient was found to have a highly mobile interatrial septum with a PFO present.  Outpatient follow-up was recommended for consideration of PFO closure, but the patient did not have health insurance at the time.   He underwent successful transcatheter PFO closure with a 25 mm Abbott talisman PFO occluder device on 06/07/22. Post op echo showed EF 55% with no atrial shunt.  Today the patient presents to clinic for follow up. Here with his daughter. No CP or SOB. No LE edema, orthopnea or PND. syncope. No blood in stool or urine. No palpitations. He did have a dizzy spell on 7/24 while at his son's Drs apt. This was short lived and did not happen again. Having a sleep study soon with his PCP.   Past Medical History:  Diagnosis Date   Hyperglycemia    Hypertension     Past Surgical History:  Procedure Laterality Date   BUBBLE STUDY  04/06/2021   Procedure: BUBBLE STUDY;  Surgeon: Josue Hector, MD;  Location: [place];  Service: Cardiovascular;;   IR CT HEAD LTD  04/04/2021   IR PERCUTANEOUS ART THROMBECTOMY/INFUSION INTRACRANIAL INC DIAG ANGIO  04/04/2021   PATENT FORAMEN OVALE(PFO) CLOSURE N/A 06/07/2022    Procedure: PATENT FORAMEN OVALE(PFO) CLOSURE;  Surgeon: Sherren Mocha, MD;  Location: [place] CV LAB;  Service: Cardiovascular;  Laterality: N/A;   TEE WITHOUT CARDIOVERSION N/A 04/06/2021   Procedure: TRANSESOPHAGEAL ECHOCARDIOGRAM (TEE);  Surgeon: Josue Hector, MD;  Location: [facility] ENDOSCOPY;  Service: Cardiovascular;  Laterality: N/A;    Current Medications: Current Meds  Medication Sig   amLODipine (NORVASC) 2.5 MG tablet Take 2.5 mg by mouth daily.   aspirin EC 81 MG tablet Take 81 mg by mouth daily. Swallow whole.   atorvastatin (LIPITOR) 20 MG tablet Take 20 mg by mouth at bedtime.   clopidogrel (PLAVIX) 75 MG tablet Take 1 tablet (75 mg total) by mouth daily. Start 5 days prior to procedure (procedure scheduled for 06/07/22) (Patient taking differently: Take 75 mg by mouth daily. Stop after prescription runs out)     Allergies:   Patient has no known allergies.   Social History   Socioeconomic History   Marital status: Single    Spouse name: Not on file   Number of children: Not on file   Years of education: Not on file   Highest education level: Not on file  Occupational History   Not on file  Tobacco Use   Smoking status: Every Day  Packs/day: 1.00    Types: Cigarettes   Smokeless tobacco: Never  Substance and Sexual Activity   Alcohol use: Not on file   Drug use: Not on file   Sexual activity: Not on file  Other Topics Concern   Not on file  Social History Narrative   Not on file   Social Determinants of Health   Financial Resource Strain: Not on file  Food Insecurity: Not on file  Transportation Needs: Not on file  Physical Activity: Not on file  Stress: Not on file  Social Connections: Not on file     Family History: The patient's family history includes Dementia in his mother.  ROS:   Please see the history of present illness.    All other systems reviewed and are negative.  EKGs/Labs/Other Studies Reviewed:    The following studies were  reviewed today:  06/07/22 PATENT FORAMEN OVALE(PFO) CLOSURE   Conclusion  Successful transcatheter PFO closure with a 25 mm Abbott talisman PFO occluder device using intracardiac echo and fluoroscopic guidance   Recommendations: 1.  Dual antiplatelet therapy with aspirin and clopidogrel for 6 months, then indefinite aspirin 2.  SBE prophylaxis when indicated x6 months 3.  Same-day discharge protocol with limited 2D echocardiogram prior to discharge.  Discharge home today if criteria met.   __________________________  06/07/22 IMPRESSIONS   1. Patient is s/p PFO closure with a 53m Abbott Talisman occluder  device. Device appears well seated. There is no residual leak visualized  by color doppler interrogation.   2. Left ventricular ejection fraction, by estimation, is 55 to 60%. The  left ventricle has normal function.   3. Right ventricular systolic function is normal.   4. The mitral valve is normal in structure.   5. The aortic valve is tricuspid.   EKG:  EKG is NOT ordered today.   Recent Labs: 05/11/2022: BUN 6; Creatinine, Ser 0.97; Hemoglobin 16.8; Platelets 273; Potassium 4.5; Sodium 139  Recent Lipid Panel    Component Value Date/Time   CHOL 176 04/05/2021 0604   TRIG 121 04/05/2021 0604   HDL 32 (L) 04/05/2021 0604   CHOLHDL 5.5 04/05/2021 0604   VLDL 24 04/05/2021 0604   LDLCALC 120 (H) 04/05/2021 0604     Risk Assessment/Calculations:       Physical Exam:    VS:  BP 118/72   Pulse 76   Ht '5\' 4"'  (1.626 m)   Wt 151 lb (68.5 kg)   BMI 25.92 kg/m     Wt Readings from Last 3 Encounters:  07/12/22 151 lb (68.5 kg)  06/07/22 154 lb 15.7 oz (70.3 kg)  05/11/22 151 lb (68.5 kg)     GEN:  Well nourished, well developed in no acute distress HEENT: Normal NECK: No JVD LYMPHATICS: No lymphadenopathy CARDIAC: RRR, no murmurs, rubs, gallops RESPIRATORY:  Clear to auscultation without rales, wheezing or rhonchi  ABDOMEN: Soft, non-tender,  non-distended MUSCULOSKELETAL:  No edema; No deformity  SKIN: Warm and dry NEUROLOGIC:  Alert and oriented x 3 PSYCHIATRIC:  Normal affect   ASSESSMENT:    1. PFO (patent foramen ovale)   2. Essential hypertension   3. Dizziness    PLAN:    In order of problems listed above:  S/p percutaneous PFO closure: continue dual antiplatelet therapy with aspirin and clopidogrel for 6 months, then indefinite aspirin. SBE prophylaxis when indicated x6 months- he wears full dentures. We will see him back in 1 year with an echo/bubble.   HTN: Bp well  controlled. No changes made  Dizziness: one episode of this with no recurrence. He will call us back if this recurs.   Medication Adjustments/Labs and Tests Ordered: Current medicines are reviewed at length with the patient today.  Concerns regarding medicines are outlined above.  Orders Placed This Encounter  Procedures   ECHOCARDIOGRAM LIMITED BUBBLE STUDY   No orders of the defined types were placed in this encounter.   Patient Instructions  Medication Instructions:  Stop Plavix after your prescription runs out  *If you need a refill on your cardiac medications before your next appointment, please call your pharmacy*   Lab Work: None ordered   If you have labs (blood work) drawn today and your tests are completely normal, you will receive your results only by: [place] (if you have MyChart) OR A paper copy in the mail If you have any lab test that is abnormal or we need to change your treatment, we will call you to review the results.   Testing/Procedures: Your physician has requested that you have an bubble echocardiogram in 1 year. Echocardiography is a painless test that uses sound waves to create images of your heart. It provides your doctor with information about the size and shape of your heart and how well your heart's chambers and valves are working. This procedure takes approximately one hour. There are no  restrictions for this procedure.    Follow-Up: Follow up as scheduled    Other Instructions None      Signed, Angelena Form, PA-C  07/13/2022 10:49 AM    [facility]

## 2022-07-12 ENCOUNTER — Ambulatory Visit: Payer: Medicaid Other | Admitting: Physician Assistant

## 2022-07-12 VITALS — BP 118/72 | HR 76 | Ht 64.0 in | Wt 151.0 lb

## 2022-07-12 DIAGNOSIS — Q2112 Patent foramen ovale: Secondary | ICD-10-CM | POA: Diagnosis not present

## 2022-07-12 DIAGNOSIS — I10 Essential (primary) hypertension: Secondary | ICD-10-CM

## 2022-07-12 DIAGNOSIS — R42 Dizziness and giddiness: Secondary | ICD-10-CM | POA: Diagnosis not present

## 2022-07-12 NOTE — Patient Instructions (Addendum)
Medication Instructions:  Stop Plavix after your prescription runs out  *If you need a refill on your cardiac medications before your next appointment, please call your pharmacy*   Lab Work: None ordered   If you have labs (blood work) drawn today and your tests are completely normal, you will receive your results only by: MyChart Message (if you have MyChart) OR A paper copy in the mail If you have any lab test that is abnormal or we need to change your treatment, we will call you to review the results.   Testing/Procedures: Your physician has requested that you have an bubble echocardiogram in 1 year. Echocardiography is a painless test that uses sound waves to create images of your heart. It provides your doctor with information about the size and shape of your heart and how well your heart's chambers and valves are working. This procedure takes approximately one hour. There are no restrictions for this procedure.    Follow-Up: Follow up as scheduled    Other Instructions None

## 2023-06-04 ENCOUNTER — Ambulatory Visit: Payer: Medicaid Other

## 2023-06-04 ENCOUNTER — Other Ambulatory Visit (HOSPITAL_COMMUNITY): Payer: Medicaid Other

## 2023-06-04 ENCOUNTER — Telehealth: Payer: Self-pay | Admitting: Physician Assistant

## 2023-06-04 NOTE — Telephone Encounter (Signed)
Pt's daughter Caleb Donaldson is requesting a callback at [phone] regarding upcoming appts and test. Please advise

## 2023-06-04 NOTE — Telephone Encounter (Signed)
Spoke with patient's daughter Chyrel Masson (OK per DPR). She states patient would like to know what tests/appointments he has coming up. Chyrel Masson states patient is concerned about being "put under."  Informed Cierra that patient is scheduled for an echocardiogram and 1 year follow-up with structural APP. He will not require anesthesia for this.  Cierra verbalized understanding and expressed appreciation for call.

## 2023-06-05 NOTE — Progress Notes (Signed)
[facility]   MULTIDISCIPLINARY HEART VALVE CLINIC                                     Cardiology Office Note:    Date:  06/07/2023   ID:  Caleb Donaldson, DOB November 01, 1966, MRN [id number]  PCP:  Buckner Malta, NP  [facility] Cardiologist: Dr. Jamey Ripa [facility] Electrophysiologist:  None   Referring MD: Buckner Malta, NP   1 year s/p PFO closure   History of Present Illness:    Caleb Donaldson is a 57 y.o. male with a hx of HTN, CVA and PFO s/p PFO closure (06/07/22) who presents to clinic for follow up.   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 His stroke occurred in an embolic pattern.  Work-up during his hospitalization showed no clear source of embolus, but the patient was found to have a highly mobile interatrial septum with a PFO present.  Outpatient follow-up was recommended for consideration of PFO closure, but the patient did not have health insurance at the time.   He underwent successful transcatheter PFO closure with a 25 mm Abbott talisman PFO occluder device on 06/07/22. Post op echo showed EF 55% with no atrial shunt.  Today the patient presents to clinic for follow up. Here alone. No CP or SOB. No LE edema, orthopnea or PND. No dizziness or syncope. No blood in stool or urine. No palpitations. Extremely grateful to Dr. Excell Seltzer for his service.    Past Medical History:  Diagnosis Date   Hyperglycemia    Hypertension     Past Surgical History:  Procedure Laterality Date   BUBBLE STUDY  04/06/2021   Procedure: BUBBLE STUDY;  Surgeon: Wendall Stade, MD;  Location: [facility] ENDOSCOPY;  Service: Cardiovascular;;   IR CT HEAD LTD  04/04/2021   IR PERCUTANEOUS ART THROMBECTOMY/INFUSION INTRACRANIAL INC DIAG ANGIO  04/04/2021   PATENT FORAMEN OVALE(PFO) CLOSURE N/A 06/07/2022   Procedure: PATENT FORAMEN OVALE(PFO) CLOSURE;  Surgeon: Tonny Bollman, MD;  Location: [facility]  INVASIVE CV LAB;  Service: Cardiovascular;  Laterality: N/A;   TEE WITHOUT CARDIOVERSION N/A 04/06/2021   Procedure: TRANSESOPHAGEAL ECHOCARDIOGRAM (TEE);  Surgeon: Wendall Stade, MD;  Location: [facility] ENDOSCOPY;  Service: Cardiovascular;  Laterality: N/A;    Current Medications: Current Meds  Medication Sig   aspirin EC 81 MG tablet Take 81 mg by mouth daily. Swallow whole.   clopidogrel (PLAVIX) 75 MG tablet Take 1 tablet (75 mg total) by mouth daily. Start 5 days prior to procedure (procedure scheduled for 06/07/22) (Patient taking differently: Take 75 mg by mouth daily. Stop after prescription runs out)     Allergies:   Patient has no known allergies.   Social History   Socioeconomic History   Marital status: Single    Spouse name: Not on file   Number of children: Not on file   Years of education: Not on file   Highest education level: Not on file  Occupational History   Not on file  Tobacco Use   Smoking status: Every Day    Current packs/day: 1.00    Types: Cigarettes   Smokeless tobacco: Never  Substance and Sexual Activity   Alcohol use: Not on file   Drug use: Not on file   Sexual activity: Not on file  Other Topics Concern   Not on  file  Social History Narrative   Not on file   Social Determinants of Health   Financial Resource Strain: Not on file  Food Insecurity: Not on file  Transportation Needs: Not on file  Physical Activity: Not on file  Stress: Not on file  Social Connections: Not on file     Family History: The patient's family history includes Dementia in his mother.  ROS:   Please see the history of present illness.    All other systems reviewed and are negative.  EKGs/Labs/Other Studies Reviewed:    The following studies were reviewed today:  06/07/22 PATENT FORAMEN OVALE(PFO) CLOSURE   Conclusion  Successful transcatheter PFO closure with a 25 mm Abbott talisman PFO occluder device using intracardiac echo and fluoroscopic guidance    Recommendations: 1.  Dual antiplatelet therapy with aspirin and clopidogrel for 6 months, then indefinite aspirin 2.  SBE prophylaxis when indicated x6 months 3.  Same-day discharge protocol with limited 2D echocardiogram prior to discharge.  Discharge home today if criteria met.   __________________________  06/07/22 IMPRESSIONS   1. Patient is s/p PFO closure with a 25mm Abbott Talisman occluder  device. Device appears well seated. There is no residual leak visualized  by color doppler interrogation.   2. Left ventricular ejection fraction, by estimation, is 55 to 60%. The  left ventricle has normal function.   3. Right ventricular systolic function is normal.   4. The mitral valve is normal in structure.   5. The aortic valve is tricuspid.   _____________________  Limited echo with bubble 07/13/23 IMPRESSIONS  1. Left ventricular ejection fraction, by estimation, is 55 to 60%. The  left ventricle has normal function. The left ventricle has no regional  wall motion abnormalities. Left ventricular diastolic parameters are  consistent with Grade I diastolic  dysfunction (impaired relaxation).   2. Right ventricular systolic function is normal.   3. PFO closure device in place and well seated. No residual leak by color  Doppler or with bubble study. Agitated saline contrast bubble study was  negative, with no evidence of any interatrial shunt.   4. The aortic valve is tricuspid. Aortic valve sclerosis/calcification is  present, without any evidence of aortic stenosis.   Conclusion(s)/Recommendation(s): Limited study to assess s/p PFO closure.  Device is well-seated. No residual leak. Bubble study negative.   EKG:  EKG is NOT ordered today.   Recent Labs: No results found for requested labs within last 365 days.  Recent Lipid Panel    Component Value Date/Time   CHOL 176 04/05/2021 0604   TRIG 121 04/05/2021 0604   HDL 32 (L) 04/05/2021 0604   CHOLHDL 5.5 04/05/2021 0604    VLDL 24 04/05/2021 0604   LDLCALC 120 (H) 04/05/2021 0604     Risk Assessment/Calculations:       Physical Exam:    VS:  BP 110/82   Pulse 72   Ht 5\' 4"  (1.626 m)   Wt 151 lb 12.8 oz (68.9 kg)   SpO2 98%   BMI 26.06 kg/m     Wt Readings from Last 3 Encounters:  06/06/23 151 lb 12.8 oz (68.9 kg)  07/12/22 151 lb (68.5 kg)  06/07/22 154 lb 15.7 oz (70.3 kg)     GEN:  Well nourished, well developed in no acute distress HEENT: Normal NECK: No JVD LYMPHATICS: No lymphadenopathy CARDIAC: RRR, no murmurs, rubs, gallops RESPIRATORY:  Clear to auscultation without rales, wheezing or rhonchi  ABDOMEN: Soft, non-tender, non-distended MUSCULOSKELETAL:  No edema; No deformity  SKIN: Warm and dry NEUROLOGIC:  Alert and oriented x 3 PSYCHIATRIC:  Normal affect   ASSESSMENT:    1. S/P percutaneous patent foramen ovale closure   2. Essential hypertension     PLAN:    In order of problems listed above:  PFO s/p percutaneous PFO closure: echo with bubble study today showed EF 55-60% with negative bubble study. Continue on a baby aspirin. No longer requires SBE prophylaxis. PRN follow up.   HTN: Bp well controlled off all medications.   Aortic valve sclerosis: noted on echo today. No evidence of stenosis.   Medication Adjustments/Labs and Tests Ordered: Current medicines are reviewed at length with the patient today.  Concerns regarding medicines are outlined above.  No orders of the defined types were placed in this encounter.  No orders of the defined types were placed in this encounter.   Patient Instructions  Medication Instructions:  No changes *If you need a refill on your cardiac medications before your next appointment, please call your pharmacy*   Lab Work: none If you have labs (blood work) drawn today and your tests are completely normal, you will receive your results only by: MyChart Message (if you have MyChart) OR A paper copy in the mail If you  have any lab test that is abnormal or we need to change your treatment, we will call you to review the results.   Testing/Procedures: none   Follow-Up: As needed   Signed, Cline Crock, PA-C  06/07/2023 8:37 AM    [facility]

## 2023-06-06 ENCOUNTER — Ambulatory Visit (HOSPITAL_BASED_OUTPATIENT_CLINIC_OR_DEPARTMENT_OTHER): Payer: Medicaid Other

## 2023-06-06 ENCOUNTER — Ambulatory Visit: Payer: Medicaid Other | Attending: Internal Medicine | Admitting: Physician Assistant

## 2023-06-06 VITALS — BP 110/82 | HR 72 | Ht 64.0 in | Wt 151.8 lb

## 2023-06-06 DIAGNOSIS — Q2112 Patent foramen ovale: Secondary | ICD-10-CM | POA: Diagnosis not present

## 2023-06-06 DIAGNOSIS — Z8774 Personal history of (corrected) congenital malformations of heart and circulatory system: Secondary | ICD-10-CM | POA: Diagnosis present

## 2023-06-06 DIAGNOSIS — I10 Essential (primary) hypertension: Secondary | ICD-10-CM

## 2023-06-06 LAB — ECHOCARDIOGRAM LIMITED BUBBLE STUDY
Area-P 1/2: 6.43 cm2
S' Lateral: 2.9 cm

## 2023-06-06 NOTE — Patient Instructions (Signed)
